# Patient Record
Sex: Female | Born: 1970
Health system: Southern US, Community
[De-identification: ages and names within clinical notes are randomized; demographics above are authoritative.]

## PROBLEM LIST (undated history)

## (undated) DIAGNOSIS — J45909 Unspecified asthma, uncomplicated: Secondary | ICD-10-CM

## (undated) DIAGNOSIS — R600 Localized edema: Secondary | ICD-10-CM

## (undated) DIAGNOSIS — F419 Anxiety disorder, unspecified: Secondary | ICD-10-CM

## (undated) DIAGNOSIS — K219 Gastro-esophageal reflux disease without esophagitis: Secondary | ICD-10-CM

## (undated) DIAGNOSIS — E119 Type 2 diabetes mellitus without complications: Secondary | ICD-10-CM

## (undated) DIAGNOSIS — F32A Depression, unspecified: Secondary | ICD-10-CM

## (undated) DIAGNOSIS — I10 Essential (primary) hypertension: Secondary | ICD-10-CM

## (undated) DIAGNOSIS — E7800 Pure hypercholesterolemia, unspecified: Secondary | ICD-10-CM

## (undated) DIAGNOSIS — R0602 Shortness of breath: Secondary | ICD-10-CM

## (undated) DIAGNOSIS — F329 Major depressive disorder, single episode, unspecified: Secondary | ICD-10-CM

## (undated) DIAGNOSIS — D649 Anemia, unspecified: Secondary | ICD-10-CM

## (undated) HISTORY — DX: Anxiety disorder, unspecified: F41.9

## (undated) HISTORY — PX: TUBAL LIGATION: SHX77

## (undated) HISTORY — DX: Shortness of breath: R06.02

## (undated) HISTORY — DX: Pure hypercholesterolemia, unspecified: E78.00

## (undated) HISTORY — DX: Localized edema: R60.0

## (undated) HISTORY — DX: Essential (primary) hypertension: I10

---

## 2003-08-06 ENCOUNTER — Emergency Department (HOSPITAL_COMMUNITY): Admission: EM | Admit: 2003-08-06 | Discharge: 2003-08-06 | Payer: Self-pay | Admitting: Emergency Medicine

## 2010-01-17 ENCOUNTER — Emergency Department (HOSPITAL_COMMUNITY)
Admission: EM | Admit: 2010-01-17 | Discharge: 2010-01-17 | Payer: Self-pay | Source: Home / Self Care | Admitting: Emergency Medicine

## 2010-09-21 ENCOUNTER — Emergency Department (HOSPITAL_COMMUNITY)
Admission: EM | Admit: 2010-09-21 | Discharge: 2010-09-21 | Disposition: A | Discharge status: Home / Self Care | Payer: PRIVATE HEALTH INSURANCE | Attending: Emergency Medicine | Admitting: Emergency Medicine

## 2010-09-21 DIAGNOSIS — R112 Nausea with vomiting, unspecified: Secondary | ICD-10-CM | POA: Insufficient documentation

## 2010-09-21 DIAGNOSIS — D649 Anemia, unspecified: Secondary | ICD-10-CM | POA: Insufficient documentation

## 2010-09-21 DIAGNOSIS — J029 Acute pharyngitis, unspecified: Secondary | ICD-10-CM | POA: Insufficient documentation

## 2010-09-21 DIAGNOSIS — R51 Headache: Secondary | ICD-10-CM

## 2010-09-21 HISTORY — DX: Anemia, unspecified: D64.9

## 2010-09-21 LAB — RAPID STREP SCREEN (MED CTR MEBANE ONLY): Streptococcus, Group A Screen (Direct): NEGATIVE

## 2010-09-21 MED ORDER — PROMETHAZINE HCL 25 MG/ML IJ SOLN
25.0000 mg | Freq: Once | INTRAMUSCULAR | Status: AC
  Administered 2010-09-21: 25 mg via INTRAMUSCULAR
  Filled 2010-09-21: qty 1

## 2010-09-21 MED ORDER — PROMETHAZINE HCL 25 MG PO TABS: 25.0000 mg | ORAL_TABLET | Freq: Four times a day (QID) | ORAL | Status: AC | PRN

## 2010-09-21 MED ORDER — KETOROLAC TROMETHAMINE 60 MG/2ML IM SOLN
60.0000 mg | Freq: Once | INTRAMUSCULAR | Status: AC
  Administered 2010-09-21: 60 mg via INTRAMUSCULAR
  Filled 2010-09-21: qty 2

## 2010-09-21 MED ORDER — ONDANSETRON HCL 4 MG PO TABS
8.0000 mg | ORAL_TABLET | Freq: Once | ORAL | Status: AC
  Administered 2010-09-21: 8 mg via ORAL
  Filled 2010-09-21: qty 1

## 2010-09-21 NOTE — ED Notes (Signed)
Complain of n/v/d and fever since last night

## 2010-09-21 NOTE — ED Provider Notes (Signed)
Scribed for Ward Givens, MD, the patient was seen in room APA08/APA08 . This chart was scribed by Ellie Lunch. This patient's care was started at 5:45 PM.   CSN: [id number] Arrival date & time: 09/21/2010  5:36 PM  Chief Complaint  Patient presents with  . Emesis   HPI Vanessa George is a healthy 40 y.o. female who presents to the Emergency Department complaining of sore throat and emesis. Pt c/o a sore throat starting yesterday with associated chills, lightheadedness, and nonproductive cough. Pt took dayquil today with enough relief to go to work. About an hour ago Pt began to feel nauseous, had one episode of emesis, and developed a headache. Pt denies abd pain, fever, chest pain or SOB. Denies sick contacts. States she feels lightheaded. Denies diarrhea.   Past Medical History  Diagnosis Date  . Anemia     History reviewed. No pertinent past surgical history.  MEDICATIONS:  Previous Medications   FERROUS SULFATE 325 (65 FE) MG TABLET    Take 325 mg by mouth daily with breakfast.       ALLERGIES:  Allergies as of 09/21/2010  . (No Known Allergies)     History reviewed. No pertinent family history.  History  Substance Use Topics  . Smoking status: Never Smoker   . Smokeless tobacco: Not on file  . Alcohol Use: No  Works at [employer]. No PCP. Married  Review of Systems  Constitutional: Positive for chills. Negative for fever.  HENT: Positive for sore throat.   Eyes: Positive for photophobia.  Respiratory: Positive for cough. Negative for shortness of breath.   Cardiovascular: Negative for chest pain.  Gastrointestinal: Positive for nausea and vomiting. Negative for abdominal pain and diarrhea.  Neurological: Positive for light-headedness and headaches.  All other systems reviewed and are negative.    Physical Exam  BP 150/81  Pulse 90  Temp(Src) 98.4 F (36.9 C) (Oral)  Resp 16  Ht 5\' 6"  (1.676 m)  Wt 180 lb (81.647 kg)  BMI 29.05 kg/m2  SpO2 100%   LMP 09/03/2010  Physical Exam  Constitutional: She is oriented to person, place, and time. She appears well-developed and well-nourished.  HENT:  Head: Normocephalic and atraumatic.  Mouth/Throat: Uvula is midline, oropharynx is clear and moist and mucous membranes are normal. No oropharyngeal exudate, posterior oropharyngeal edema, posterior oropharyngeal erythema or tonsillar abscesses.  Eyes: Conjunctivae and EOM are normal. Pupils are equal, round, and reactive to light.  Neck: Normal range of motion. Neck supple.  Cardiovascular: Normal rate and normal heart sounds.   Pulmonary/Chest: Effort normal and breath sounds normal.  Abdominal: Soft. Bowel sounds are normal.  Musculoskeletal: Normal range of motion.  Neurological: She is alert and oriented to person, place, and time.  Skin: Skin is warm and dry.  Psychiatric: She has a normal mood and affect. Her behavior is normal. Thought content normal.    Procedures  OTHER DATA REVIEWED: Nursing notes, vital signs, and past medical records reviewed.  DIAGNOSTIC STUDIES: Oxygen Saturation is 100% on room air, normal by my interpretation.    LABS: Results for orders placed during the hospital encounter of 09/21/10  RAPID STREP SCREEN      Component Value Range   Streptococcus, Group A Screen (Direct) NEGATIVE  NEGATIVE    ED COURSE / COORDINATION OF CARE:  MDM:  MEDICATIONS GIVEN IN THE E.D.  Medications  ondansetron (ZOFRAN) tablet 8 mg (8 mg Oral Given 09/21/10 1833)  ketorolac (TORADOL) injection 60 mg (  60 mg Intramuscular Given 09/21/10 1834)  promethazine (PHENERGAN) injection 25 mg (25 mg Intramuscular Given 09/21/10 1834)    Pt given above meds, states her nausea is gone, her headache is gone, still has a sore throat.   DISCHARGE MEDICATIONS: New Prescriptions   No medications on file    I personally performed the services described in this documentation, which was scribed in my presence. The recorded information  has been reviewed and considered. Devoria Albe, MD, Armando Gang       Ward Givens, MD 09/21/10 [phone]

## 2010-09-21 NOTE — ED Notes (Signed)
Pt states had sore throat yesterday, however has progressed today into headache, N/V, fever and chills. 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

## 2010-09-27 ENCOUNTER — Encounter (HOSPITAL_COMMUNITY): Payer: Self-pay

## 2010-09-27 ENCOUNTER — Emergency Department (HOSPITAL_COMMUNITY): Payer: PRIVATE HEALTH INSURANCE

## 2010-09-27 ENCOUNTER — Emergency Department (HOSPITAL_COMMUNITY)
Admission: EM | Admit: 2010-09-27 | Discharge: 2010-09-27 | Disposition: A | Discharge status: Home / Self Care | Payer: PRIVATE HEALTH INSURANCE | Attending: Emergency Medicine | Admitting: Emergency Medicine

## 2010-09-27 DIAGNOSIS — Z862 Personal history of diseases of the blood and blood-forming organs and certain disorders involving the immune mechanism: Secondary | ICD-10-CM | POA: Insufficient documentation

## 2010-09-27 DIAGNOSIS — J40 Bronchitis, not specified as acute or chronic: Secondary | ICD-10-CM | POA: Insufficient documentation

## 2010-09-27 DIAGNOSIS — H6690 Otitis media, unspecified, unspecified ear: Secondary | ICD-10-CM | POA: Insufficient documentation

## 2010-09-27 LAB — POCT PREGNANCY, URINE: Preg Test, Ur: NEGATIVE

## 2010-09-27 MED ORDER — ALBUTEROL SULFATE HFA 108 (90 BASE) MCG/ACT IN AERS
2.0000 | INHALATION_SPRAY | Freq: Once | RESPIRATORY_TRACT | Status: AC
  Administered 2010-09-27: 2 via RESPIRATORY_TRACT
  Filled 2010-09-27 (×2): qty 6.7

## 2010-09-27 MED ORDER — AZITHROMYCIN 250 MG PO TABS
500.0000 mg | ORAL_TABLET | Freq: Once | ORAL | Status: AC
  Administered 2010-09-27: 500 mg via ORAL
  Filled 2010-09-27: qty 2

## 2010-09-27 MED ORDER — AZITHROMYCIN 250 MG PO TABS: ORAL_TABLET | ORAL | Status: AC

## 2010-09-27 NOTE — ED Provider Notes (Signed)
History     CSN: [id number] Arrival date & time: 09/27/2010  2:51 PM  Chief Complaint  Patient presents with  . Cough  . Nasal Congestion  . Shortness of Breath  . Ear Fullness   Patient is a 40 y.o. female presenting with cough, shortness of breath, and plugged ear sensation. The history is provided by the patient.  Cough This is a new problem. The current episode started more than 2 days ago. The cough is productive of sputum. Maximum temperature: Subjective fevers. Associated symptoms include ear congestion, ear pain, rhinorrhea, sore throat and shortness of breath. Pertinent negatives include no chest pain and no headaches. Associated symptoms comments: Nasal congestion with yellow drainage.  Also with complaint of coughing yellow sputum.  Bilateral ears feel plugged with the left also causing pain.. She is a smoker. Her past medical history does not include pneumonia or asthma.  Shortness of Breath  Associated symptoms include a fever, rhinorrhea, sore throat, cough and shortness of breath. Pertinent negatives include no chest pain. Her past medical history does not include asthma.  Ear Fullness This is a new problem. The current episode started yesterday. The problem occurs constantly. The problem has been unchanged. Associated symptoms include congestion, coughing, a fever and a sore throat. Pertinent negatives include no abdominal pain, arthralgias, chest pain, headaches, joint swelling, nausea, neck pain, numbness, rash, swollen glands, vertigo or weakness. Treatments tried: otc cough and cold remedies, sudafed. The treatment provided no relief.    Past Medical History  Diagnosis Date  . Anemia   . Anemia     History reviewed. No pertinent past surgical history.  No family history on file.  History  Substance Use Topics  . Smoking status: Never Smoker   . Smokeless tobacco: Not on file  . Alcohol Use: No    OB History    Grav Para Term Preterm Abortions TAB SAB Ect Mult  Living                  Review of Systems  Constitutional: Positive for fever.  HENT: Positive for hearing loss, ear pain, congestion, sore throat, rhinorrhea, postnasal drip and sinus pressure. Negative for nosebleeds, facial swelling, neck pain, tinnitus and ear discharge.   Eyes: Negative.   Respiratory: Positive for cough and shortness of breath. Negative for chest tightness.   Cardiovascular: Negative for chest pain.  Gastrointestinal: Negative for nausea and abdominal pain.  Genitourinary: Negative.   Musculoskeletal: Negative for joint swelling and arthralgias.  Skin: Negative.  Negative for rash and wound.  Neurological: Negative for dizziness, vertigo, weakness, light-headedness, numbness and headaches.  Hematological: Negative.   Psychiatric/Behavioral: Negative.     Physical Exam  BP 146/75  Pulse 88  Temp(Src) 98.6 F (37 C) (Oral)  Resp 18  Ht 5\' 6"  (1.676 m)  Wt 180 lb (81.647 kg)  BMI 29.05 kg/m2  SpO2 100%  LMP 09/03/2010  Physical Exam  Nursing note and vitals reviewed. Constitutional: She is oriented to person, place, and time. She appears well-developed and well-nourished.  HENT:  Head: Normocephalic and atraumatic.  Left Ear: There is swelling. Tympanic membrane is injected and bulging.  Eyes: Conjunctivae are normal.  Neck: Normal range of motion.  Cardiovascular: Normal rate, regular rhythm, normal heart sounds and intact distal pulses.   Pulmonary/Chest: Effort normal. No respiratory distress. She has wheezes in the right lower field. She has rhonchi in the left upper field. She has no rales. She exhibits no tenderness.  Abdominal: Soft.  Bowel sounds are normal. There is no tenderness.  Musculoskeletal: Normal range of motion.  Neurological: She is alert and oriented to person, place, and time.  Skin: Skin is warm and dry.  Psychiatric: She has a normal mood and affect.    ED Course  Procedures  MDM cxr with no evidence of active infection.   Otitis media - left per exam.        Candis Musa, PA 09/27/10 1658  Correction - patient does not smoke tobacco products.  Candis Musa, PA 09/27/10 1704  Candis Musa, PA 09/27/10 1709  Candis Musa, PA 09/27/10 1711

## 2010-09-27 NOTE — ED Notes (Signed)
Pt reports coming here on last Tues for sore thoat and cough.  Pt has since worsened and now has congestion, and some left ear fullness, and some sob.

## 2010-09-28 NOTE — ED Provider Notes (Signed)
Medical screening examination/treatment/procedure(s) were performed by non-physician practitioner and as supervising physician I was immediately available for consultation/collaboration. Devoria Albe, MD, [facility] Gang  Ward Givens, MD 09/28/10 [phone]

## 2012-08-02 ENCOUNTER — Other Ambulatory Visit (HOSPITAL_COMMUNITY): Payer: Self-pay | Admitting: Physician Assistant

## 2012-08-02 DIAGNOSIS — Z139 Encounter for screening, unspecified: Secondary | ICD-10-CM

## 2012-08-07 ENCOUNTER — Ambulatory Visit (HOSPITAL_COMMUNITY)
Admission: RE | Admit: 2012-08-07 | Discharge: 2012-08-07 | Disposition: A | Discharge status: Home / Self Care | Payer: BC Managed Care – PPO | Source: Ambulatory Visit | Attending: Physician Assistant | Admitting: Physician Assistant

## 2012-08-07 DIAGNOSIS — Z139 Encounter for screening, unspecified: Secondary | ICD-10-CM

## 2012-08-07 DIAGNOSIS — Z1231 Encounter for screening mammogram for malignant neoplasm of breast: Secondary | ICD-10-CM | POA: Insufficient documentation

## 2013-03-24 ENCOUNTER — Emergency Department (HOSPITAL_COMMUNITY): Payer: BC Managed Care – PPO

## 2013-03-24 ENCOUNTER — Emergency Department (HOSPITAL_COMMUNITY)
Admission: EM | Admit: 2013-03-24 | Discharge: 2013-03-24 | Disposition: A | Discharge status: Home / Self Care | Payer: BC Managed Care – PPO | Attending: Emergency Medicine | Admitting: Emergency Medicine

## 2013-03-24 ENCOUNTER — Encounter (HOSPITAL_COMMUNITY): Payer: Self-pay | Admitting: Emergency Medicine

## 2013-03-24 DIAGNOSIS — Y929 Unspecified place or not applicable: Secondary | ICD-10-CM | POA: Insufficient documentation

## 2013-03-24 DIAGNOSIS — X500XXA Overexertion from strenuous movement or load, initial encounter: Secondary | ICD-10-CM | POA: Insufficient documentation

## 2013-03-24 DIAGNOSIS — M51379 Other intervertebral disc degeneration, lumbosacral region without mention of lumbar back pain or lower extremity pain: Secondary | ICD-10-CM | POA: Insufficient documentation

## 2013-03-24 DIAGNOSIS — S39012A Strain of muscle, fascia and tendon of lower back, initial encounter: Secondary | ICD-10-CM

## 2013-03-24 DIAGNOSIS — R209 Unspecified disturbances of skin sensation: Secondary | ICD-10-CM | POA: Insufficient documentation

## 2013-03-24 DIAGNOSIS — M5137 Other intervertebral disc degeneration, lumbosacral region: Secondary | ICD-10-CM | POA: Insufficient documentation

## 2013-03-24 DIAGNOSIS — Y9389 Activity, other specified: Secondary | ICD-10-CM | POA: Insufficient documentation

## 2013-03-24 DIAGNOSIS — Z79899 Other long term (current) drug therapy: Secondary | ICD-10-CM | POA: Insufficient documentation

## 2013-03-24 DIAGNOSIS — S335XXA Sprain of ligaments of lumbar spine, initial encounter: Secondary | ICD-10-CM | POA: Insufficient documentation

## 2013-03-24 DIAGNOSIS — Z3202 Encounter for pregnancy test, result negative: Secondary | ICD-10-CM | POA: Insufficient documentation

## 2013-03-24 DIAGNOSIS — Y990 Civilian activity done for income or pay: Secondary | ICD-10-CM | POA: Insufficient documentation

## 2013-03-24 DIAGNOSIS — D649 Anemia, unspecified: Secondary | ICD-10-CM | POA: Insufficient documentation

## 2013-03-24 DIAGNOSIS — IMO0002 Reserved for concepts with insufficient information to code with codable children: Secondary | ICD-10-CM | POA: Insufficient documentation

## 2013-03-24 LAB — POC URINE PREG, ED: PREG TEST UR: NEGATIVE

## 2013-03-24 MED ORDER — OXYCODONE-ACETAMINOPHEN 5-325 MG PO TABS: 0.5000 | ORAL_TABLET | ORAL | Status: DC | PRN

## 2013-03-24 MED ORDER — OXYCODONE-ACETAMINOPHEN 5-325 MG PO TABS
1.0000 | ORAL_TABLET | Freq: Once | ORAL | Status: AC
  Administered 2013-03-24: 1 via ORAL
  Filled 2013-03-24: qty 1

## 2013-03-24 MED ORDER — IBUPROFEN 600 MG PO TABS: 600.0000 mg | ORAL_TABLET | Freq: Four times a day (QID) | ORAL | Status: DC | PRN

## 2013-03-24 NOTE — ED Notes (Signed)
Pt helped a patient getting off the floor about a week ago, pt now has lower back now and has increased, today pain radiating down right and left legs, states toes numb feeling

## 2013-03-24 NOTE — ED Provider Notes (Signed)
CSN: [id number]     Arrival date & time 03/24/13  1333 History   First MD Initiated Contact with Patient 03/24/13 1355     Chief Complaint  Patient presents with  . Back Pain     (Consider location/radiation/quality/duration/timing/severity/associated sxs/prior Treatment) HPI Comments: Tabria Steines is a 43 y.o. 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  She has a history of occasional low back pain, but has resolved with a day or 2 of rest.  She described pain which radiates down her medial and posterior legs to her mid thighs.  She also has numbness in her bilateral great toes only.  She denies weakness in her legs, and has had no saddle anesthesia, no urinary or bowel retention or incontinence.  She has taken ibuprofen without pain relief.       The history is provided by the patient.    Past Medical History  Diagnosis Date  . Anemia   . Anemia    Past Surgical History  Procedure Laterality Date  . Ectopic pregnancy surgery     History reviewed. No pertinent family history. History  Substance Use Topics  . Smoking status: Never Smoker   . Smokeless tobacco: Not on file  . Alcohol Use: No   OB History   Grav Para Term Preterm Abortions TAB SAB Ect Mult Living                 Review of Systems  Constitutional: Negative for fever.  Respiratory: Negative for shortness of breath.   Cardiovascular: Negative for chest pain and leg swelling.  Gastrointestinal: Negative for abdominal pain, constipation and abdominal distention.  Genitourinary: Negative for dysuria, urgency, frequency, flank pain and difficulty urinating.  Musculoskeletal: Positive for back pain. Negative for gait problem and joint swelling.  Skin: Negative for rash.  Neurological: Positive for numbness. Negative for weakness.      Allergies  Vicodin  Home Medications   Current Outpatient Rx   Name  Route  Sig  Dispense  Refill  . ferrous sulfate 325 (65 FE) MG tablet   Oral   Take 325 mg by mouth every other day.          . Multiple Vitamin (MULTIVITAMIN WITH MINERALS) TABS tablet   Oral   Take 1 tablet by mouth daily.         . VENTOLIN HFA 108 (90 BASE) MCG/ACT inhaler   Inhalation   Inhale 2 puffs into the lungs every 4 (four) hours as needed for wheezing or shortness of breath.          Marland Kitchen ibuprofen (ADVIL,MOTRIN) 600 MG tablet   Oral   Take 1 tablet (600 mg total) by mouth every 6 (six) hours as needed.   30 tablet   0   . nitrofurantoin, macrocrystal-monohydrate, (MACROBID) 100 MG capsule   Oral   Take 100 mg by mouth 2 (two) times daily.         Marland Kitchen oxyCODONE-acetaminophen (PERCOCET/ROXICET) 5-325 MG per tablet   Oral   Take 0.5-1 tablets by mouth every 4 (four) hours as needed for severe pain.   15 tablet   0    BP 155/80  Pulse 78  Temp(Src) 97.4 F (36.3 C) (Oral)  Resp 20  Ht 5\' 5"  (1.651 m)  Wt 230 lb (104.327 kg)  BMI 38.27 kg/m2  SpO2 100%  LMP 03/05/2013 Physical Exam  Nursing note and vitals reviewed. Constitutional: She appears well-developed and well-nourished.  HENT:  Head: Normocephalic.  Eyes: Conjunctivae are normal.  Neck: Normal range of motion. Neck supple.  Cardiovascular: Normal rate and intact distal pulses.   Pulses:      Dorsalis pedis pulses are 2+ on the right side, and 2+ on the left side.  Pedal pulses normal.  Pulmonary/Chest: Effort normal.  Abdominal: Soft. Bowel sounds are normal. She exhibits no distension and no mass.  Musculoskeletal: Normal range of motion. She exhibits no edema.       Lumbar back: She exhibits tenderness. She exhibits no swelling, no edema and no spasm.  Neurological: She is alert. She has normal strength. She displays no atrophy and no tremor. No sensory deficit. Gait normal.  Reflex Scores:      Patellar reflexes are 2+ on the right side and 2+ on the left side.      Achilles  reflexes are 2+ on the right side and 2+ on the left side. No strength deficit noted in hip and knee flexor and extensor muscle groups.  Ankle flexion and extension intact. Negative babinski.    Skin: Skin is warm and dry.  Psychiatric: She has a normal mood and affect.    ED Course  Procedures (including critical care time) Labs Review Labs Reviewed  POC URINE PREG, ED   Imaging Review Dg Lumbar Spine Complete  03/24/2013   CLINICAL DATA:  Low back pain after lifting  EXAM: LUMBAR SPINE - COMPLETE 4+ VIEW  COMPARISON:  09/03/2010  FINDINGS: There is no evidence of lumbar spine fracture. Alignment is normal. Early narrowing of the L4-5 interspace as before. Facet degenerative hypertrophy and spurring bilaterally L4-5 and L5-S1.  IMPRESSION: 1. Negative for fracture or other acute bone abnormality. 2. Stable degenerative changes L4-S1   Electronically Signed   By: Arne Cleveland M.D.   On: 03/24/2013 15:51     EKG Interpretation None      MDM   Final diagnoses:  Lumbar strain    Lumbar paralumbar strain with radiculopathy.  No obvious deformity on plain xrays,  Chronic mild djd at L5-S1.  Pt was prescribed ibuprofen, oxycodone - 1/2 to 1 tab q 4 hours.  Activity as tolerated without bending, or lifting.  She was given a work note with these restrictions.  Encouraged heat therapy to lower back.  Plan followup by PCP for recheck in one week if symptoms are not improving.  No neuro deficit on exam or by history to suggest emergent or surgical presentation.  Also discussed worsened sx that should prompt immediate re-evaluation including distal weakness, bowel/bladder retention/incontinence.          Evalee Jefferson, PA-C 03/24/13 [phone]

## 2013-03-24 NOTE — ED Provider Notes (Signed)
Medical screening examination/treatment/procedure(s) were performed by non-physician practitioner and as supervising physician I was immediately available for consultation/collaboration.   EKG Interpretation None       Nat Christen, MD 03/24/13 2239

## 2013-03-24 NOTE — Discharge Instructions (Signed)
Back Pain, Adult Low back pain is very common. About 1 in 5 people have back pain.The cause of low back pain is rarely dangerous. The pain often gets better over time.About half of people with a sudden onset of back pain feel better in just 2 weeks. About 8 in 10 people feel better by 6 weeks.  CAUSES Some common causes of back pain include:  Strain of the muscles or ligaments supporting the spine.  Wear and tear (degeneration) of the spinal discs.  Arthritis.  Direct injury to the back. DIAGNOSIS Most of the time, the direct cause of low back pain is not known.However, back pain can be treated effectively even when the exact cause of the pain is unknown.Answering your caregiver's questions about your overall health and symptoms is one of the most accurate ways to make sure the cause of your pain is not dangerous. If your caregiver needs more information, he or she may order lab work or imaging tests (X-rays or MRIs).However, even if imaging tests show changes in your back, this usually does not require surgery. HOME CARE INSTRUCTIONS For many people, back pain returns.Since low back pain is rarely dangerous, it is often a condition that people can learn to [facility] their own.   Remain active. It is stressful on the back to sit or stand in one place. Do not sit, drive, or stand in one place for more than 30 minutes at a time. Take short walks on level surfaces as soon as pain allows.Try to increase the length of time you walk each day.  Do not stay in bed.Resting more than 1 or 2 days can delay your recovery.  Do not avoid exercise or work.Your body is made to move.It is not dangerous to be active, even though your back may hurt.Your back will likely heal faster if you return to being active before your pain is gone.  Pay attention to your body when you bend and lift. Many people have less discomfortwhen lifting if they bend their knees, keep the load close to their bodies,and  avoid twisting. Often, the most comfortable positions are those that put less stress on your recovering back.  Find a comfortable position to sleep. Use a firm mattress and lie on your side with your knees slightly bent. If you lie on your back, put a pillow under your knees.  Only take over-the-counter or prescription medicines as directed by your caregiver. Over-the-counter medicines to reduce pain and inflammation are often the most helpful.Your caregiver may prescribe muscle relaxant drugs.These medicines help dull your pain so you can more quickly return to your normal activities and healthy exercise.  Put ice on the injured area.  Put ice in a plastic bag.  Place a towel between your skin and the bag.  Leave the ice on for 15-20 minutes, 03-04 times a day for the first 2 to 3 days. After that, ice and heat may be alternated to reduce pain and spasms.  Ask your caregiver about trying back exercises and gentle massage. This may be of some benefit.  Avoid feeling anxious or stressed.Stress increases muscle tension and can worsen back pain.It is important to recognize when you are anxious or stressed and learn ways to manage it.Exercise is a great option. SEEK MEDICAL CARE IF:  You have pain that is not relieved with rest or medicine.  You have pain that does not improve in 1 week.  You have new symptoms.  You are generally not feeling well. SEEK  IMMEDIATE MEDICAL CARE IF:   You have pain that radiates from your back into your legs.  You develop new bowel or bladder control problems.  You have unusual weakness or numbness in your arms or legs.  You develop nausea or vomiting.  You develop abdominal pain.  You feel faint. Document Released: 01/10/2005 Document Revised: 07/12/2011 Document Reviewed: 05/31/2010 [facility] Patient Information 2014 [place], [place].  Use the medications as directed.  Do not drive within 4 hours of taking oxycodone as this will make you  drowsy.  Avoid lifting,  Bending,  Twisting or any other activity that worsens your pain over the next week.  Apply an  icepack  to your lower back for 10-15 minutes every 2 hours for the next 2 days.  You should get rechecked if your symptoms are not better over the next 5 days,  Or you develop increased pain,  Weakness in your leg(s) or loss of bladder or bowel function - these are symptoms of a worse injury.

## 2014-04-28 ENCOUNTER — Encounter (HOSPITAL_COMMUNITY): Payer: Self-pay | Admitting: Emergency Medicine

## 2014-04-28 ENCOUNTER — Emergency Department (HOSPITAL_COMMUNITY)
Admission: EM | Admit: 2014-04-28 | Discharge: 2014-04-28 | Disposition: A | Discharge status: Home / Self Care | Payer: Self-pay | Attending: Emergency Medicine | Admitting: Emergency Medicine

## 2014-04-28 ENCOUNTER — Emergency Department (HOSPITAL_COMMUNITY): Payer: Self-pay

## 2014-04-28 DIAGNOSIS — Y9389 Activity, other specified: Secondary | ICD-10-CM | POA: Insufficient documentation

## 2014-04-28 DIAGNOSIS — Z79899 Other long term (current) drug therapy: Secondary | ICD-10-CM | POA: Insufficient documentation

## 2014-04-28 DIAGNOSIS — Y998 Other external cause status: Secondary | ICD-10-CM | POA: Insufficient documentation

## 2014-04-28 DIAGNOSIS — D649 Anemia, unspecified: Secondary | ICD-10-CM | POA: Insufficient documentation

## 2014-04-28 DIAGNOSIS — S39012A Strain of muscle, fascia and tendon of lower back, initial encounter: Secondary | ICD-10-CM | POA: Insufficient documentation

## 2014-04-28 DIAGNOSIS — Y92410 Unspecified street and highway as the place of occurrence of the external cause: Secondary | ICD-10-CM | POA: Insufficient documentation

## 2014-04-28 MED ORDER — IBUPROFEN 800 MG PO TABS
800.0000 mg | ORAL_TABLET | Freq: Once | ORAL | Status: AC
  Administered 2014-04-28: 800 mg via ORAL
  Filled 2014-04-28: qty 1

## 2014-04-28 MED ORDER — NAPROXEN 500 MG PO TABS: 500.0000 mg | ORAL_TABLET | Freq: Two times a day (BID) | ORAL | Status: DC
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
## 2014-07-17 ENCOUNTER — Other Ambulatory Visit (HOSPITAL_COMMUNITY): Payer: Self-pay | Admitting: Family Medicine

## 2014-07-17 DIAGNOSIS — Z1231 Encounter for screening mammogram for malignant neoplasm of breast: Secondary | ICD-10-CM

## 2014-08-04 ENCOUNTER — Ambulatory Visit (HOSPITAL_COMMUNITY): Payer: Self-pay

## 2014-08-06 ENCOUNTER — Ambulatory Visit (HOSPITAL_COMMUNITY)
Admission: RE | Admit: 2014-08-06 | Discharge: 2014-08-06 | Disposition: A | Discharge status: Home / Self Care | Payer: BLUE CROSS/BLUE SHIELD | Source: Ambulatory Visit | Attending: Family Medicine | Admitting: Family Medicine

## 2014-08-06 DIAGNOSIS — Z1231 Encounter for screening mammogram for malignant neoplasm of breast: Secondary | ICD-10-CM | POA: Insufficient documentation

## 2015-08-07 DIAGNOSIS — Z1389 Encounter for screening for other disorder: Secondary | ICD-10-CM | POA: Diagnosis not present

## 2015-08-07 DIAGNOSIS — K219 Gastro-esophageal reflux disease without esophagitis: Secondary | ICD-10-CM | POA: Diagnosis not present

## 2015-08-07 DIAGNOSIS — Z6841 Body Mass Index (BMI) 40.0 and over, adult: Secondary | ICD-10-CM | POA: Diagnosis not present

## 2015-08-07 DIAGNOSIS — R12 Heartburn: Secondary | ICD-10-CM | POA: Diagnosis not present

## 2015-08-17 ENCOUNTER — Other Ambulatory Visit (HOSPITAL_COMMUNITY): Payer: Self-pay | Admitting: Family Medicine

## 2015-08-17 DIAGNOSIS — Z1231 Encounter for screening mammogram for malignant neoplasm of breast: Secondary | ICD-10-CM

## 2015-08-21 ENCOUNTER — Ambulatory Visit (HOSPITAL_COMMUNITY): Payer: BLUE CROSS/BLUE SHIELD

## 2015-08-28 ENCOUNTER — Encounter (HOSPITAL_COMMUNITY): Payer: Self-pay | Admitting: Radiology

## 2015-08-28 ENCOUNTER — Ambulatory Visit (HOSPITAL_COMMUNITY)
Admission: RE | Admit: 2015-08-28 | Discharge: 2015-08-28 | Disposition: A | Discharge status: Home / Self Care | Payer: BLUE CROSS/BLUE SHIELD | Source: Ambulatory Visit | Attending: Family Medicine | Admitting: Family Medicine

## 2015-08-28 DIAGNOSIS — Z1231 Encounter for screening mammogram for malignant neoplasm of breast: Secondary | ICD-10-CM | POA: Diagnosis not present

## 2015-10-19 DIAGNOSIS — R3129 Other microscopic hematuria: Secondary | ICD-10-CM | POA: Diagnosis not present

## 2015-10-22 DIAGNOSIS — R3129 Other microscopic hematuria: Secondary | ICD-10-CM | POA: Diagnosis not present

## 2015-10-22 DIAGNOSIS — K760 Fatty (change of) liver, not elsewhere classified: Secondary | ICD-10-CM | POA: Diagnosis not present

## 2015-11-03 DIAGNOSIS — R3129 Other microscopic hematuria: Secondary | ICD-10-CM | POA: Diagnosis not present

## 2015-12-24 ENCOUNTER — Emergency Department (HOSPITAL_COMMUNITY)
Admission: EM | Admit: 2015-12-24 | Discharge: 2015-12-24 | Disposition: A | Discharge status: Home / Self Care | Payer: BLUE CROSS/BLUE SHIELD | Attending: Emergency Medicine | Admitting: Emergency Medicine

## 2015-12-24 ENCOUNTER — Encounter (HOSPITAL_COMMUNITY): Payer: Self-pay | Admitting: Emergency Medicine

## 2015-12-24 ENCOUNTER — Emergency Department (HOSPITAL_COMMUNITY): Payer: BLUE CROSS/BLUE SHIELD

## 2015-12-24 DIAGNOSIS — M79602 Pain in left arm: Secondary | ICD-10-CM | POA: Diagnosis not present

## 2015-12-24 DIAGNOSIS — Z79899 Other long term (current) drug therapy: Secondary | ICD-10-CM | POA: Diagnosis not present

## 2015-12-24 DIAGNOSIS — R079 Chest pain, unspecified: Secondary | ICD-10-CM

## 2015-12-24 DIAGNOSIS — J45909 Unspecified asthma, uncomplicated: Secondary | ICD-10-CM | POA: Insufficient documentation

## 2015-12-24 DIAGNOSIS — Z7982 Long term (current) use of aspirin: Secondary | ICD-10-CM | POA: Insufficient documentation

## 2015-12-24 DIAGNOSIS — I10 Essential (primary) hypertension: Secondary | ICD-10-CM | POA: Diagnosis not present

## 2015-12-24 HISTORY — DX: Unspecified asthma, uncomplicated: J45.909

## 2015-12-24 HISTORY — DX: Gastro-esophageal reflux disease without esophagitis: K21.9

## 2015-12-24 LAB — CBC
HCT: 47.5 % — ABNORMAL HIGH (ref 36.0–46.0)
Hemoglobin: 15.7 g/dL — ABNORMAL HIGH (ref 12.0–15.0)
MCH: 27.2 pg (ref 26.0–34.0)
MCHC: 33.1 g/dL (ref 30.0–36.0)
MCV: 82.3 fL (ref 78.0–100.0)
Platelets: 232 10*3/uL (ref 150–400)
RBC: 5.77 MIL/uL — ABNORMAL HIGH (ref 3.87–5.11)
RDW: 15.9 % — ABNORMAL HIGH (ref 11.5–15.5)
WBC: 13.2 10*3/uL — ABNORMAL HIGH (ref 4.0–10.5)

## 2015-12-24 LAB — BASIC METABOLIC PANEL
Anion gap: 9 (ref 5–15)
BUN: 10 mg/dL (ref 6–20)
CO2: 29 mmol/L (ref 22–32)
Calcium: 9.4 mg/dL (ref 8.9–10.3)
Chloride: 97 mmol/L — ABNORMAL LOW (ref 101–111)
Creatinine, Ser: 1.08 mg/dL — ABNORMAL HIGH (ref 0.44–1.00)
GFR calc Af Amer: >60 mL/min (ref >60)
GFR calc non Af Amer: >60 mL/min (ref >60)
Glucose, Bld: 97 mg/dL (ref 65–99)
Potassium: 3.2 mmol/L — ABNORMAL LOW (ref 3.5–5.1)
Sodium: 135 mmol/L (ref 135–145)

## 2015-12-24 LAB — TROPONIN I
Troponin I: <0.03 ng/mL (ref <0.03)
Troponin I: <0.03 ng/mL (ref <0.03)

## 2015-12-24 NOTE — ED Triage Notes (Signed)
Pt reports intermittent cp in left chest starting 2 days ago radiating to left arm and to back. Pt has relief with aspirin or sprite. Denies injury. Pt alert and oriented at this time.

## 2015-12-24 NOTE — ED Provider Notes (Signed)
[facility] DEPT Provider Note   CSN: [id number] Arrival date & time: 12/24/15  1745  By signing my name below, I, Higinio Plan, attest that this documentation has been prepared under the direction and in the presence of Virgel Manifold, MD . Electronically Signed: Higinio Plan, Scribe. 12/24/2015. 9:10 PM.  History   Chief Complaint Chief Complaint  Patient presents with  . Chest Pain   The history is provided by the patient. No language interpreter was used.   HPI Comments: Vanessa George is a 45 y.o. female with PMHx of Acid Reflux and HTN, who presents to the Emergency Department complaining of gradually improving, intermittent, central chest pain that began yesterday morning. Pt reports her pain radiates into her left arm and "between her shoulder blades." She notes her episodes of chest pain last for "5-10 minutes" before it spontaneously improves. She states she is not currently experiencing any chest pain in the ED; she notes her last episode occurred at ~3:00 PM this afternoon. She reports her chest pain is not triggered by anything and is not exacerbated with certain movements or positions. Pt states she initially believed her symptoms were due to acid reflux as she has "had [employer] fried chicken two nights in a row." She states her symptoms of chest pain feel "somewhat similar" to her symptoms of acid reflux; she notes she was only placed on her medication for acid reflux 3 months ago. She reports she has tried taking aspirin and drinking sprite with mild relief. Pt denies shortness of breath, abdominal pain, diaphoresis, leg pain or swelling, recent long travel and hx of PE or stress tests.  Pt reports she recently received a CT scan ~1 month ago due to sudden onset, hematuria and received tests for her kidney function and cancer which were "negative."   PCP: Dr. Hilma Favors   Past Medical History:  Diagnosis Date  . Acid reflux   . Anemia   . Anemia   . Asthma   . Hypertension     There are no active problems to display for this patient.  Past Surgical History:  Procedure Laterality Date  . ECTOPIC PREGNANCY SURGERY      OB History    Gravida Para Term Preterm AB Living             0   SAB TAB Ectopic Multiple Live Births                 Home Medications    Prior to Admission medications   Medication Sig Start Date End Date Taking? Authorizing Provider  ferrous sulfate 325 (65 FE) MG tablet Take 325 mg by mouth every other day.     Historical Provider, MD  ibuprofen (ADVIL,MOTRIN) 600 MG tablet Take 1 tablet (600 mg total) by mouth every 6 (six) hours as needed. Patient not taking: Reported on 04/28/2014 03/24/13   Evalee Jefferson, PA-C  Multiple Vitamin (MULTIVITAMIN WITH MINERALS) TABS tablet Take 1 tablet by mouth daily.    Historical Provider, MD  naproxen (NAPROSYN) 500 MG tablet Take 1 tablet (500 mg total) by mouth 2 (two) times daily. 04/28/14   [place], NP  oxyCODONE-acetaminophen (PERCOCET/ROXICET) 5-325 MG per tablet Take 0.5-1 tablets by mouth every 4 (four) hours as needed for severe pain. Patient not taking: Reported on 04/28/2014 03/24/13   Evalee Jefferson, PA-C  VENTOLIN HFA 108 (90 BASE) MCG/ACT inhaler Inhale 2 puffs into the lungs every 4 (four) hours as needed for wheezing or shortness of breath.  02/27/13   Historical Provider, MD    Family History History reviewed. No pertinent family history.  Social History Social History  Substance Use Topics  . Smoking status: Never Smoker  . Smokeless tobacco: Not on file  . Alcohol use No   Allergies   Vicodin [hydrocodone-acetaminophen]  Review of Systems Review of Systems  Constitutional: Negative for diaphoresis.  Respiratory: Negative for shortness of breath.   Cardiovascular: Positive for chest pain. Negative for leg swelling.  Gastrointestinal: Negative for abdominal pain.  Musculoskeletal: Positive for arthralgias (left arm).  All other systems reviewed and are negative.  Physical  Exam Updated Vital Signs BP 189/94 (BP Location: Left Arm)   Pulse 101   Temp 98.3 F (36.8 C) (Oral)   Resp 18   Ht 5\' 5"  (1.651 m)   Wt 252 lb (114.3 kg)   LMP 12/24/2015   SpO2 100%   BMI 41.93 kg/m   Physical Exam  Constitutional: She is oriented to person, place, and time. She appears well-developed and well-nourished. No distress.  HENT:  Head: Normocephalic and atraumatic.  Eyes: EOM are normal.  Neck: Normal range of motion.  Cardiovascular: Normal rate, regular rhythm and normal heart sounds.   Pulmonary/Chest: Effort normal and breath sounds normal.  Abdominal: Soft. She exhibits no distension. There is no tenderness.  Musculoskeletal: Normal range of motion. She exhibits no edema.  No lower extremity edema   Neurological: She is alert and oriented to person, place, and time.  Skin: Skin is warm and dry.  Psychiatric: She has a normal mood and affect. Judgment normal.  Nursing note and vitals reviewed.  ED Treatments / Results  Labs (all labs ordered are listed, but only abnormal results are displayed) Labs Reviewed  BASIC METABOLIC PANEL - Abnormal; Notable for the following:       Result Value   Potassium 3.2 (*)    Chloride 97 (*)    Creatinine, Ser 1.08 (*)    All other components within normal limits  CBC - Abnormal; Notable for the following:    WBC 13.2 (*)    RBC 5.77 (*)    Hemoglobin 15.7 (*)    HCT 47.5 (*)    RDW 15.9 (*)    All other components within normal limits  TROPONIN I  TROPONIN I    EKG  EKG Interpretation  Date/Time:  Thursday December 24 2015 17:55:26 EST Ventricular Rate:  98 PR Interval:  168 QRS Duration: 84 QT Interval:  346 QTC Calculation: 441 R Axis:   74 Text Interpretation:  Normal sinus rhythm Cannot rule out Anterior infarct , age undetermined Abnormal ECG Non-specific ST-t changes No old tracing to compare Confirmed by Wilson Singer  MD, Kemp [phone]) on 12/24/2015 8:17:23 PM       Radiology Dg Chest 2  View  Result Date: 12/24/2015 CLINICAL DATA:  Central to left-sided chest pain with radiation to the scapula EXAM: CHEST  2 VIEW COMPARISON:  None. FINDINGS: The heart size and mediastinal contours are within normal limits. Both lungs are clear. The visualized skeletal structures are unremarkable. IMPRESSION: No active cardiopulmonary disease. Electronically Signed   By: Donavan Foil M.D.   On: 12/24/2015 18:21    Procedures Procedures (including critical care time)  Medications Ordered in ED Medications - No data to display  DIAGNOSTIC STUDIES:  Oxygen Saturation is 100% on RA, normal by my interpretation.    COORDINATION OF CARE:  9:05 PM Discussed treatment plan with pt at bedside and pt  agreed to plan.  Initial Impression / Assessment and Plan / ED Course  I have reviewed the triage vital signs and the nursing notes.  Pertinent labs & imaging results that were available during my care of the patient were reviewed by me and considered in my medical decision making (see chart for details).  Clinical Course     I personally preformed the services scribed in my presence. The recorded information has been reviewed is accurate. Virgel Manifold, MD.   Final Clinical Impressions(s) / ED Diagnoses   Final diagnoses:  Nonspecific chest pain    New Prescriptions New Prescriptions   No medications on file     Virgel Manifold, MD 01/01/16 [phone]

## 2016-01-04 DIAGNOSIS — E786 Lipoprotein deficiency: Secondary | ICD-10-CM | POA: Diagnosis not present

## 2016-01-04 DIAGNOSIS — R0789 Other chest pain: Secondary | ICD-10-CM | POA: Diagnosis not present

## 2016-01-04 DIAGNOSIS — Z1389 Encounter for screening for other disorder: Secondary | ICD-10-CM | POA: Diagnosis not present

## 2016-01-04 DIAGNOSIS — I10 Essential (primary) hypertension: Secondary | ICD-10-CM | POA: Diagnosis not present

## 2016-01-04 DIAGNOSIS — E876 Hypokalemia: Secondary | ICD-10-CM | POA: Diagnosis not present

## 2016-01-04 DIAGNOSIS — Z6841 Body Mass Index (BMI) 40.0 and over, adult: Secondary | ICD-10-CM | POA: Diagnosis not present

## 2016-02-04 DIAGNOSIS — E876 Hypokalemia: Secondary | ICD-10-CM | POA: Diagnosis not present

## 2016-02-04 DIAGNOSIS — Z1389 Encounter for screening for other disorder: Secondary | ICD-10-CM | POA: Diagnosis not present

## 2016-02-04 DIAGNOSIS — Z01411 Encounter for gynecological examination (general) (routine) with abnormal findings: Secondary | ICD-10-CM | POA: Diagnosis not present

## 2016-02-04 DIAGNOSIS — Z6841 Body Mass Index (BMI) 40.0 and over, adult: Secondary | ICD-10-CM | POA: Diagnosis not present

## 2016-02-22 ENCOUNTER — Ambulatory Visit (INDEPENDENT_AMBULATORY_CARE_PROVIDER_SITE_OTHER): Payer: BLUE CROSS/BLUE SHIELD | Admitting: Cardiology

## 2016-02-22 ENCOUNTER — Encounter: Payer: Self-pay | Admitting: Cardiology

## 2016-02-22 VITALS — BP 128/78 | HR 91 | Ht 65.0 in | Wt 259.0 lb

## 2016-02-22 DIAGNOSIS — R011 Cardiac murmur, unspecified: Secondary | ICD-10-CM | POA: Diagnosis not present

## 2016-02-22 DIAGNOSIS — Z8679 Personal history of other diseases of the circulatory system: Secondary | ICD-10-CM | POA: Diagnosis not present

## 2016-02-22 DIAGNOSIS — R0789 Other chest pain: Secondary | ICD-10-CM | POA: Diagnosis not present

## 2016-02-22 DIAGNOSIS — Z8719 Personal history of other diseases of the digestive system: Secondary | ICD-10-CM

## 2016-02-22 NOTE — Patient Instructions (Signed)
Your physician recommends that you schedule a follow-up appointment in: to be determined after tests   Your physician has requested that you have an exercise tolerance test. For further information please visit [email or web address]. Please also follow instruction sheet, as given.   Your physician has requested that you have an echocardiogram. Echocardiography is a painless test that uses sound waves to create images of your heart. It provides your doctor with information about the size and shape of your heart and how well your heart's chambers and valves are working. This procedure takes approximately one hour. There are no restrictions for this procedure.    Thank you for choosing [facility] !         

## 2016-02-22 NOTE — Progress Notes (Signed)
Cardiology Office Note  Date: 02/22/2016   ID: Vanessa George, DOB [birth year], MRN [id number]  PCP: Purvis Kilts, MD  Consulting Cardiologist: Rozann Lesches, MD   Chief Complaint  Patient presents with  . Chest Pain    History of Present Illness: Vanessa George is a 46 y.o. female referred for cardiology consultation by Dr. Hilma Favors. She presents for the evaluation of episodic chest discomfort. 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 She developed an episode of left arm tightness associated with upper mid scapular discomfort and left anterior chest discomfort when she was at work one day back in November of last year. She was seen in the ER at that time, cardiac markers negative for ACS and ECG nonspecific. Chest x-ray did not show any active process. She states that she had had an episode like this prior to the event in question.  Cardiac risk factors include hypertension, she was previously on HCTZ. This was stopped since November of last year. Patient's father had a heart attack at age 48. She does not smoke cigarettes.  She has had a long-standing heart murmur by report.  She tells me that she is interested in losing weight and plans to join a gym for a regular exercise plan soon.  Past Medical History:  Diagnosis Date  . Acid reflux   . Anemia   . Asthma   . Essential hypertension     Past Surgical History:  Procedure Laterality Date  . TUBAL LIGATION      Current Outpatient Prescriptions  Medication Sig Dispense Refill  . aspirin 81 MG chewable tablet Chew 324 mg by mouth once as needed.    . Multiple Vitamin (MULTIVITAMIN WITH MINERALS) TABS tablet Take 1 tablet by mouth daily.    . pantoprazole (PROTONIX) 40 MG tablet Take 40 mg by mouth daily.   0  . VENTOLIN HFA 108 (90 BASE) MCG/ACT inhaler Inhale 2 puffs into the lungs every 4 (four) hours as needed for wheezing or shortness of breath.      No current  facility-administered medications for this visit.    Allergies:  Vicodin [hydrocodone-acetaminophen]   Social History: The patient  reports that she has never smoked. She has never used smokeless tobacco. She reports that she does not drink alcohol or use drugs.   ROS:  Please see the history of present illness. Otherwise, complete review of systems is positive for none.  All other systems are reviewed and negative.   Physical Exam: VS:  BP 128/78   Pulse 91   Ht 5\' 5"  (1.651 m)   Wt 259 lb (117.5 kg)   SpO2 98%   BMI 43.10 kg/m , BMI Body mass index is 43.1 kg/m.  Wt Readings from Last 3 Encounters:  02/22/16 259 lb (117.5 kg)  12/24/15 252 lb (114.3 kg)  04/28/14 230 lb (104.3 kg)    General: Obese woman, appears comfortable at rest. HEENT: Conjunctiva and lids normal, oropharynx clear. Neck: Supple, no elevated JVP or carotid bruits, no thyromegaly. Lungs: Clear to auscultation, nonlabored breathing at rest. Cardiac: Regular rate and rhythm, no S3, 2/6 systolic murmur, no pericardial rub. Abdomen: Soft, nontender, bowel sounds present, no guarding or rebound. Extremities: No pitting edema, distal pulses 2+. Skin: Warm and dry. Musculoskeletal: No kyphosis. Neuropsychiatric: Alert and oriented x3, affect grossly appropriate.  ECG: I personally reviewed the tracing from 12/24/2015 which showed normal sinus rhythm with decreased R wave  progression and nonspecific T-wave changes.  Recent Labwork: 12/24/2015: BUN 10; Creatinine, Ser 1.08; Hemoglobin 15.7; Platelets 232; Potassium 3.2; Sodium 135 March 2017: Hemoglobin 14.4, platelets 206, BUN 6, creatinine 1.0, potassium 4.4, AST 16, ALT 18,   Other Studies Reviewed Today:  Chest x-ray 12/24/2015: FINDINGS: The heart size and mediastinal contours are within normal limits. Both lungs are clear. The visualized skeletal structures are unremarkable.  IMPRESSION: No active cardiopulmonary disease.  Assessment and  Plan:  1. Episodic atypical chest discomfort. ECG reviewed and nonspecific from ER visit November 2017 at which point cardiac markers were negative for ACS. She has a history of hypertension, also family history of CAD in her father. She would like to start a regular exercise plan and has not undergone any prior ischemic testing. We will obtain a GXT.  2. Reportedly long-standing heart murmur. Echocardiogram will be obtained.  3. History of hypertension, systolic blood pressure in the 120s today on no specific treatment. Weight loss and exercise would also be beneficial.  4. Reportedly stable reflux symptoms on proton pump inhibitor.  Current medicines were reviewed with the patient today.   Orders Placed This Encounter  Procedures  . Exercise Tolerance Test  . ECHOCARDIOGRAM COMPLETE    Disposition: Call with test results.  Signed, Satira Sark, MD, [facility] 02/22/2016 1:30 PM    [place] [facility] [address], [place], [place] [address] Phone: [phone]; Fax: [phone]

## 2016-03-02 ENCOUNTER — Inpatient Hospital Stay (HOSPITAL_COMMUNITY): Admission: RE | Admit: 2016-03-02 | Payer: BLUE CROSS/BLUE SHIELD | Source: Ambulatory Visit

## 2016-03-02 ENCOUNTER — Ambulatory Visit (HOSPITAL_COMMUNITY)
Admission: RE | Admit: 2016-03-02 | Discharge: 2016-03-02 | Disposition: A | Discharge status: Home / Self Care | Payer: BLUE CROSS/BLUE SHIELD | Source: Ambulatory Visit | Attending: Cardiology | Admitting: Cardiology

## 2016-03-02 DIAGNOSIS — I10 Essential (primary) hypertension: Secondary | ICD-10-CM | POA: Insufficient documentation

## 2016-03-02 DIAGNOSIS — I209 Angina pectoris, unspecified: Secondary | ICD-10-CM | POA: Diagnosis not present

## 2016-03-02 DIAGNOSIS — R011 Cardiac murmur, unspecified: Secondary | ICD-10-CM | POA: Diagnosis not present

## 2016-03-02 NOTE — Progress Notes (Signed)
*  PRELIMINARY RESULTS* Echocardiogram 2D Echocardiogram has been performed.  Vanessa George 03/02/2016, 11:23 AM

## 2016-03-09 ENCOUNTER — Ambulatory Visit (HOSPITAL_COMMUNITY): Payer: BLUE CROSS/BLUE SHIELD | Attending: Cardiology

## 2016-05-09 DIAGNOSIS — J329 Chronic sinusitis, unspecified: Secondary | ICD-10-CM | POA: Diagnosis not present

## 2016-05-09 DIAGNOSIS — J209 Acute bronchitis, unspecified: Secondary | ICD-10-CM | POA: Diagnosis not present

## 2016-05-09 DIAGNOSIS — J111 Influenza due to unidentified influenza virus with other respiratory manifestations: Secondary | ICD-10-CM | POA: Diagnosis not present

## 2016-06-09 DIAGNOSIS — J209 Acute bronchitis, unspecified: Secondary | ICD-10-CM | POA: Diagnosis not present

## 2016-06-09 DIAGNOSIS — Z6841 Body Mass Index (BMI) 40.0 and over, adult: Secondary | ICD-10-CM | POA: Diagnosis not present

## 2016-06-09 DIAGNOSIS — Z1389 Encounter for screening for other disorder: Secondary | ICD-10-CM | POA: Diagnosis not present

## 2017-04-12 DIAGNOSIS — R42 Dizziness and giddiness: Secondary | ICD-10-CM | POA: Diagnosis not present

## 2017-04-12 DIAGNOSIS — R404 Transient alteration of awareness: Secondary | ICD-10-CM | POA: Diagnosis not present

## 2017-04-13 ENCOUNTER — Other Ambulatory Visit: Payer: Self-pay

## 2017-04-13 ENCOUNTER — Encounter (HOSPITAL_COMMUNITY): Payer: Self-pay | Admitting: Emergency Medicine

## 2017-04-13 ENCOUNTER — Emergency Department (HOSPITAL_COMMUNITY)
Admission: EM | Admit: 2017-04-13 | Discharge: 2017-04-13 | Disposition: A | Discharge status: Home / Self Care | Payer: BLUE CROSS/BLUE SHIELD | Attending: Emergency Medicine | Admitting: Emergency Medicine

## 2017-04-13 DIAGNOSIS — Z79899 Other long term (current) drug therapy: Secondary | ICD-10-CM | POA: Diagnosis not present

## 2017-04-13 DIAGNOSIS — R42 Dizziness and giddiness: Secondary | ICD-10-CM | POA: Diagnosis not present

## 2017-04-13 DIAGNOSIS — I10 Essential (primary) hypertension: Secondary | ICD-10-CM | POA: Diagnosis not present

## 2017-04-13 DIAGNOSIS — J45909 Unspecified asthma, uncomplicated: Secondary | ICD-10-CM | POA: Insufficient documentation

## 2017-04-13 LAB — CBC WITH DIFFERENTIAL/PLATELET
Basophils Absolute: 0 10*3/uL (ref 0.0–0.1)
Basophils Relative: 0 %
EOS ABS: 0.1 10*3/uL (ref 0.0–0.7)
Eosinophils Relative: 1 %
HCT: 49.1 % — ABNORMAL HIGH (ref 36.0–46.0)
HEMOGLOBIN: 15.5 g/dL — AB (ref 12.0–15.0)
LYMPHS PCT: 17 %
Lymphs Abs: 1.4 10*3/uL (ref 0.7–4.0)
MCH: 25.5 pg — AB (ref 26.0–34.0)
MCHC: 31.6 g/dL (ref 30.0–36.0)
MCV: 80.9 fL (ref 78.0–100.0)
Monocytes Absolute: 0.5 10*3/uL (ref 0.1–1.0)
Monocytes Relative: 6 %
NEUTROS ABS: 6.2 10*3/uL (ref 1.7–7.7)
NEUTROS PCT: 76 %
Platelets: 204 10*3/uL (ref 150–400)
RBC: 6.07 MIL/uL — AB (ref 3.87–5.11)
RDW: 17.3 % — ABNORMAL HIGH (ref 11.5–15.5)
WBC: 8.1 10*3/uL (ref 4.0–10.5)

## 2017-04-13 LAB — URINALYSIS, ROUTINE W REFLEX MICROSCOPIC
BACTERIA UA: NONE SEEN
Bilirubin Urine: NEGATIVE
Glucose, UA: NEGATIVE mg/dL
KETONES UR: NEGATIVE mg/dL
Nitrite: NEGATIVE
PROTEIN: NEGATIVE mg/dL
Specific Gravity, Urine: 1.019 (ref 1.005–1.030)
pH: 5 (ref 5.0–8.0)

## 2017-04-13 LAB — COMPREHENSIVE METABOLIC PANEL
ALK PHOS: 60 U/L (ref 38–126)
ALT: 34 U/L (ref 14–54)
AST: 30 U/L (ref 15–41)
Albumin: 3.7 g/dL (ref 3.5–5.0)
Anion gap: 11 (ref 5–15)
BUN: 8 mg/dL (ref 6–20)
CALCIUM: 9.1 mg/dL (ref 8.9–10.3)
CO2: 25 mmol/L (ref 22–32)
CREATININE: 1.14 mg/dL — AB (ref 0.44–1.00)
Chloride: 96 mmol/L — ABNORMAL LOW (ref 101–111)
GFR calc non Af Amer: 56 mL/min — ABNORMAL LOW (ref >60)
GLUCOSE: 187 mg/dL — AB (ref 65–99)
Potassium: 3.7 mmol/L (ref 3.5–5.1)
SODIUM: 132 mmol/L — AB (ref 135–145)
Total Bilirubin: 1 mg/dL (ref 0.3–1.2)
Total Protein: 7.9 g/dL (ref 6.5–8.1)

## 2017-04-13 LAB — TSH: TSH: 1.296 u[IU]/mL (ref 0.350–4.500)

## 2017-04-13 MED ORDER — POTASSIUM CHLORIDE ER 10 MEQ PO TBCR: 10.0000 meq | EXTENDED_RELEASE_TABLET | Freq: Every day | ORAL | 0 refills | Status: DC

## 2017-04-13 MED ORDER — HYDROCHLOROTHIAZIDE 25 MG PO TABS: 25.0000 mg | ORAL_TABLET | Freq: Every day | ORAL | 0 refills | Status: DC

## 2017-04-13 NOTE — ED Notes (Signed)
Pt ambulatory to bathroom with steady gait and minimal assistance

## 2017-04-13 NOTE — ED Triage Notes (Signed)
Pt c/o of dizzy and weakness this morning. Pt states she felt this way yesterday due to not eating after work but got worse this morning.  Pt has HTN but does not take medications.  BP 183/79 in triage.

## 2017-04-13 NOTE — ED Provider Notes (Signed)
[facility] EMERGENCY DEPARTMENT Provider Note   CSN: [id number] Arrival date & time: 04/13/17  9030     History   Chief Complaint Chief Complaint  Patient presents with  . Dizziness    HPI Vanessa George is a 47 y.o. female.  Complaint is episodes of dizziness  HPI: Vanessa George is a 47 year old female.  Previously on hydrochlorothiazide for hypertension.  Discontinued this because of episodes of hypokalemia.  She has been off of it for several months.  Has had some high blood pressure readings recently.  Was considering starting back on her medicine.  An episode yesterday while driving where she felt lightheaded.  She had not eaten all day.  She stopped at a convenient store and got a candy bar and a coke.  She felt somewhat better.  She ate a sandwich when she got home last night.  She awakened this morning.  Went up and around this morning she had another episode where she felt tingly all over and slightly lightheaded.  Again ate and had improvement but not resolution.  She presents here.  Denies spinning sensation.  No shortness of breath, chest pain, nausea or vomiting.  His exertional symptoms.  No peripheral edema.  Past Medical History:  Diagnosis Date  . Acid reflux   . Anemia   . Asthma   . Essential hypertension     There are no active problems to display for this patient.   Past Surgical History:  Procedure Laterality Date  . TUBAL LIGATION      OB History    Gravida      Para      Term      Preterm      AB      Living  0     SAB      TAB      Ectopic      Multiple      Live Births               Home Medications    Prior to Admission medications   Medication Sig Start Date End Date Taking? Authorizing Provider  aspirin 81 MG chewable tablet Chew 324 mg by mouth once as needed.    [provider]  hydrochlorothiazide (HYDRODIURIL) 25 MG tablet Take 1 tablet (25 mg total) by mouth daily. 04/13/17   Tanna Furry, MD  Multiple Vitamin  (MULTIVITAMIN WITH MINERALS) TABS tablet Take 1 tablet by mouth daily.    [provider]  pantoprazole (PROTONIX) 40 MG tablet Take 40 mg by mouth daily.  11/09/15   [provider]  potassium chloride (K-DUR) 10 MEQ tablet Take 1 tablet (10 mEq total) by mouth daily. 04/13/17   Tanna Furry, MD  VENTOLIN HFA 108 (90 BASE) MCG/ACT inhaler Inhale 2 puffs into the lungs every 4 (four) hours as needed for wheezing or shortness of breath.  02/27/13   [provider]    Family History Family History  Problem Relation Age of Onset  . AAA (abdominal aortic aneurysm) Mother   . Diabetes Mother   . Heart attack Father        Age 59    Social History Social History   Tobacco Use  . Smoking status: Never Smoker  . Smokeless tobacco: Never Used  Substance Use Topics  . Alcohol use: No  . Drug use: No     Allergies   Vicodin [hydrocodone-acetaminophen]   Review of Systems Review of Systems  Constitutional: Negative for  appetite change, chills, diaphoresis, fatigue and fever.  HENT: Negative for mouth sores, sore throat and trouble swallowing.   Eyes: Negative for visual disturbance.  Respiratory: Negative for cough, chest tightness, shortness of breath and wheezing.   Cardiovascular: Negative for chest pain.  Gastrointestinal: Negative for abdominal distention, abdominal pain, diarrhea, nausea and vomiting.  Endocrine: Negative for polydipsia, polyphagia and polyuria.  Genitourinary: Negative for dysuria, frequency and hematuria.  Musculoskeletal: Negative for gait problem.  Skin: Negative for color change, pallor and rash.  Neurological: Positive for dizziness. Negative for syncope, light-headedness and headaches.  Hematological: Does not bruise/bleed easily.  Psychiatric/Behavioral: Negative for behavioral problems and confusion.     Physical Exam Updated Vital Signs BP (!) 154/74   Pulse 92   Temp 98.5 F (36.9 C) (Oral)   Resp 18   Ht 5\' 6"   (1.676 m)   Wt 104.3 kg (230 lb)   SpO2 98%   BMI 37.12 kg/m   Physical Exam  Constitutional: She is oriented to person, place, and time. She appears well-developed and well-nourished. No distress.  HENT:  Head: Normocephalic.  Eyes: Pupils are equal, round, and reactive to light. Conjunctivae are normal. No scleral icterus.  Neck: Normal range of motion. Neck supple. No thyromegaly present.  Cardiovascular: Normal rate and regular rhythm. Exam reveals no gallop and no friction rub.  No murmur heard. Pulmonary/Chest: Effort normal and breath sounds normal. No respiratory distress. She has no wheezes. She has no rales.  Abdominal: Soft. Bowel sounds are normal. She exhibits no distension. There is no tenderness. There is no rebound.  Musculoskeletal: Normal range of motion.  Neurological: She is alert and oriented to person, place, and time.  Skin: Skin is warm and dry. No rash noted.  Psychiatric: She has a normal mood and affect. Her behavior is normal.     ED Treatments / Results  Labs (all labs ordered are listed, but only abnormal results are displayed) Labs Reviewed  CBC WITH DIFFERENTIAL/PLATELET - Abnormal; Notable for the following components:      Result Value   RBC 6.07 (*)    Hemoglobin 15.5 (*)    HCT 49.1 (*)    MCH 25.5 (*)    RDW 17.3 (*)    All other components within normal limits  COMPREHENSIVE METABOLIC PANEL - Abnormal; Notable for the following components:   Sodium 132 (*)    Chloride 96 (*)    Glucose, Bld 187 (*)    Creatinine, Ser 1.14 (*)    GFR calc non Af Amer 56 (*)    All other components within normal limits  URINALYSIS, ROUTINE W REFLEX MICROSCOPIC - Abnormal; Notable for the following components:   APPearance HAZY (*)    Hgb urine dipstick MODERATE (*)    Leukocytes, UA TRACE (*)    Squamous Epithelial / LPF 0-5 (*)    All other components within normal limits  TSH    EKG  EKG Interpretation  Date/Time:  Thursday April 13 2017  10:12:07 EDT Ventricular Rate:  92 PR Interval:    QRS Duration: 81 QT Interval:  353 QTC Calculation: 437 R Axis:   77 Text Interpretation:  Sinus rhythm Probable left atrial enlargement Baseline wander in lead(s) V5 Confirmed by Tanna Furry [phone]) on 04/13/2017 10:29:11 AM       Radiology No results found.  Procedures Procedures (including critical care time)  Medications Ordered in ED Medications - No data to display   Initial Impression / Assessment and  Plan / ED Course  I have reviewed the triage vital signs and the nursing notes.  Pertinent labs & imaging results that were available during my care of the patient were reviewed by me and considered in my medical decision making (see chart for details).    .EKG Interpretation  Date/Time:  Thursday April 13 2017 10:12:07 EDT Ventricular Rate:  92 PR Interval:    QRS Duration: 81 QT Interval:  353 QTC Calculation: 437 R Axis:   77 Text Interpretation:  Sinus rhythm Probable left atrial enlargement Baseline wander in lead(s) V5 Confirmed by Tanna Furry [phone]) on 04/13/2017 10:29:11 AM   Reassuring studies here.  His K is 3.7.  Blood pressures were slightly high.  I do not feel this represents stroke CAD/ACS, malignant hypertension, hypertensive urgency.  We will restart her hydrochlorthiazide.  I have asked her to eat more regularly and to avoid fasting followed by large sugar loads.  Primary care follow-up.  Restart hydrochlorothiazide, low-dose potassium.  Final Clinical Impressions(s) / ED Diagnoses   Final diagnoses:  Dizziness    ED Discharge Orders        Ordered    hydrochlorothiazide (HYDRODIURIL) 25 MG tablet  Daily     04/13/17 1346    potassium chloride (K-DUR) 10 MEQ tablet  Daily     04/13/17 1346       Tanna Furry, MD 04/13/17 1434

## 2017-04-13 NOTE — Discharge Instructions (Addendum)
Restart, and continue your blood pressure medication. Low-dose potassium for daily use to avoid low potassium.

## 2017-04-15 ENCOUNTER — Emergency Department (HOSPITAL_COMMUNITY)
Admission: EM | Admit: 2017-04-15 | Discharge: 2017-04-15 | Disposition: A | Discharge status: Home / Self Care | Payer: BLUE CROSS/BLUE SHIELD | Attending: Emergency Medicine | Admitting: Emergency Medicine

## 2017-04-15 ENCOUNTER — Emergency Department (HOSPITAL_COMMUNITY): Payer: BLUE CROSS/BLUE SHIELD

## 2017-04-15 ENCOUNTER — Encounter (HOSPITAL_COMMUNITY): Payer: Self-pay | Admitting: Cardiology

## 2017-04-15 DIAGNOSIS — Z79899 Other long term (current) drug therapy: Secondary | ICD-10-CM | POA: Insufficient documentation

## 2017-04-15 DIAGNOSIS — E860 Dehydration: Secondary | ICD-10-CM | POA: Diagnosis not present

## 2017-04-15 DIAGNOSIS — Z7982 Long term (current) use of aspirin: Secondary | ICD-10-CM | POA: Diagnosis not present

## 2017-04-15 DIAGNOSIS — H81399 Other peripheral vertigo, unspecified ear: Secondary | ICD-10-CM | POA: Diagnosis not present

## 2017-04-15 DIAGNOSIS — J45909 Unspecified asthma, uncomplicated: Secondary | ICD-10-CM | POA: Insufficient documentation

## 2017-04-15 DIAGNOSIS — I10 Essential (primary) hypertension: Secondary | ICD-10-CM | POA: Insufficient documentation

## 2017-04-15 DIAGNOSIS — R42 Dizziness and giddiness: Secondary | ICD-10-CM | POA: Diagnosis not present

## 2017-04-15 LAB — COMPREHENSIVE METABOLIC PANEL
ALBUMIN: 4.1 g/dL (ref 3.5–5.0)
ALT: 41 U/L (ref 14–54)
AST: 41 U/L (ref 15–41)
Alkaline Phosphatase: 72 U/L (ref 38–126)
Anion gap: 14 (ref 5–15)
BUN: 11 mg/dL (ref 6–20)
CO2: 24 mmol/L (ref 22–32)
CREATININE: 1.24 mg/dL — AB (ref 0.44–1.00)
Calcium: 10.4 mg/dL — ABNORMAL HIGH (ref 8.9–10.3)
Chloride: 97 mmol/L — ABNORMAL LOW (ref 101–111)
GFR calc Af Amer: 59 mL/min — ABNORMAL LOW (ref >60)
GFR calc non Af Amer: 51 mL/min — ABNORMAL LOW (ref >60)
GLUCOSE: 141 mg/dL — AB (ref 65–99)
Potassium: 3.9 mmol/L (ref 3.5–5.1)
SODIUM: 135 mmol/L (ref 135–145)
Total Bilirubin: 1 mg/dL (ref 0.3–1.2)
Total Protein: 9.2 g/dL — ABNORMAL HIGH (ref 6.5–8.1)

## 2017-04-15 LAB — CBC WITH DIFFERENTIAL/PLATELET
Basophils Absolute: 0 10*3/uL (ref 0.0–0.1)
Basophils Relative: 0 %
EOS PCT: 0 %
Eosinophils Absolute: 0 10*3/uL (ref 0.0–0.7)
HEMATOCRIT: 51.3 % — AB (ref 36.0–46.0)
Hemoglobin: 16.7 g/dL — ABNORMAL HIGH (ref 12.0–15.0)
LYMPHS ABS: 2.6 10*3/uL (ref 0.7–4.0)
LYMPHS PCT: 18 %
MCH: 26.6 pg (ref 26.0–34.0)
MCHC: 32.6 g/dL (ref 30.0–36.0)
MCV: 81.7 fL (ref 78.0–100.0)
MONO ABS: 0.8 10*3/uL (ref 0.1–1.0)
Monocytes Relative: 6 %
Neutro Abs: 10.5 10*3/uL — ABNORMAL HIGH (ref 1.7–7.7)
Neutrophils Relative %: 76 %
PLATELETS: 255 10*3/uL (ref 150–400)
RBC: 6.28 MIL/uL — ABNORMAL HIGH (ref 3.87–5.11)
RDW: 15.2 % (ref 11.5–15.5)
WBC: 13.9 10*3/uL — ABNORMAL HIGH (ref 4.0–10.5)

## 2017-04-15 LAB — URINALYSIS, ROUTINE W REFLEX MICROSCOPIC
Bilirubin Urine: NEGATIVE
Glucose, UA: NEGATIVE mg/dL
KETONES UR: NEGATIVE mg/dL
LEUKOCYTES UA: NEGATIVE
Nitrite: NEGATIVE
PH: 6 (ref 5.0–8.0)
Protein, ur: NEGATIVE mg/dL
SPECIFIC GRAVITY, URINE: 1.009 (ref 1.005–1.030)

## 2017-04-15 LAB — PREGNANCY, URINE: Preg Test, Ur: NEGATIVE

## 2017-04-15 LAB — TROPONIN I: Troponin I: <0.03 ng/mL (ref <0.03)

## 2017-04-15 MED ORDER — MECLIZINE HCL 25 MG PO TABS: 25.0000 mg | ORAL_TABLET | Freq: Three times a day (TID) | ORAL | 0 refills | Status: DC | PRN

## 2017-04-15 MED ORDER — MECLIZINE HCL 12.5 MG PO TABS
25.0000 mg | ORAL_TABLET | Freq: Once | ORAL | Status: AC
Start: 2017-04-15 — End: 2017-04-15
  Administered 2017-04-15: 25 mg via ORAL
  Filled 2017-04-15: qty 2

## 2017-04-15 MED ORDER — SODIUM CHLORIDE 0.9 % IV BOLUS (SEPSIS)
1000.0000 mL | Freq: Once | INTRAVENOUS | Status: AC
  Administered 2017-04-15: 1000 mL via INTRAVENOUS

## 2017-04-15 NOTE — ED Triage Notes (Signed)
Dizziness since Wednesday.  Seen here with same on Thursday.

## 2017-04-15 NOTE — ED Notes (Signed)
Patient has ambulated back and forth to the bathroom with no issues.

## 2017-04-15 NOTE — ED Provider Notes (Signed)
[facility] EMERGENCY DEPARTMENT Provider Note   CSN: [id number] Arrival date & time: 04/15/17  1049     History   Chief Complaint Chief Complaint  Patient presents with  . Dizziness    HPI Mariah Gerstenberger is a 47 y.o. female.  HPI  Pt was seen at 1230. Per pt, c/o gradual onset and resolution of several intermittent episodes of "lightheadedness" this past week. These episodes occurred 2 and 3 days ago. Described the episodes as "lightheadedness." States she was not eating regularly at those times, and felt better after she ate high sugar food and drink. She also noted her "BP to be high" so she came to the ED for evaluation. Pt was restarted on her previous BP meds and rx potassium. Pt states today she "had a different episode." Pt states she has intermittently felt "waves" of "spinning" sensation today, with "felt flushed." She also states her sinuses have been congested recently. Denies CP/palpitations, no cough/SOB, no abd pain, no N/V/D, no fevers, no rash, no neck pain, no injury, no visual changes, no focal motor weakness, no tingling/numbness in extremities, no ataxia, no slurred speech, no facial droop.    Past Medical History:  Diagnosis Date  . Acid reflux   . Anemia   . Asthma   . Essential hypertension     There are no active problems to display for this patient.   Past Surgical History:  Procedure Laterality Date  . TUBAL LIGATION       OB History    Gravida      Para      Term      Preterm      AB      Living  0     SAB      TAB      Ectopic      Multiple      Live Births               Home Medications    Prior to Admission medications   Medication Sig Start Date End Date Taking? Authorizing Provider  aspirin 81 MG chewable tablet Chew 324 mg by mouth once as needed.    [provider]  hydrochlorothiazide (HYDRODIURIL) 25 MG tablet Take 1 tablet (25 mg total) by mouth daily. 04/13/17   Tanna Furry, MD  Multiple Vitamin  (MULTIVITAMIN WITH MINERALS) TABS tablet Take 1 tablet by mouth daily.    [provider]  pantoprazole (PROTONIX) 40 MG tablet Take 40 mg by mouth daily.  11/09/15   [provider]  potassium chloride (K-DUR) 10 MEQ tablet Take 1 tablet (10 mEq total) by mouth daily. 04/13/17   Tanna Furry, MD  VENTOLIN HFA 108 (90 BASE) MCG/ACT inhaler Inhale 2 puffs into the lungs every 4 (four) hours as needed for wheezing or shortness of breath.  02/27/13   [provider]    Family History Family History  Problem Relation Age of Onset  . AAA (abdominal aortic aneurysm) Mother   . Diabetes Mother   . Heart attack Father        Age 69    Social History Social History   Tobacco Use  . Smoking status: Never Smoker  . Smokeless tobacco: Never Used  Substance Use Topics  . Alcohol use: No  . Drug use: No     Allergies   Vicodin [hydrocodone-acetaminophen]   Review of Systems Review of Systems ROS: Statement: All systems negative except as marked or noted in  the HPI; Constitutional: Negative for fever and chills. ; ; Eyes: Negative for eye pain, redness and discharge. ; ; ENMT: Negative for ear pain, hoarseness, sore throat. +nasal congestion, sinus pressure. ; ; Cardiovascular: Negative for chest pain, palpitations, diaphoresis, dyspnea and peripheral edema. ; ; Respiratory: Negative for cough, wheezing and stridor. ; ; Gastrointestinal: Negative for nausea, vomiting, diarrhea, abdominal pain, blood in stool, hematemesis, jaundice and rectal bleeding. . ; ; Genitourinary: Negative for dysuria, flank pain and hematuria. ; ; Musculoskeletal: Negative for back pain and neck pain. Negative for swelling and trauma.; ; Skin: Negative for pruritus, rash, abrasions, blisters, bruising and skin lesion.; ; Neuro: +lightheadedness, now spinning sensation. Negative for headache and neck stiffness. Negative for weakness, altered level of consciousness, altered mental status, extremity  weakness, paresthesias, involuntary movement, seizure and syncope.       Physical Exam Updated Vital Signs BP 139/80 (BP Location: Left Arm)   Pulse 93   Temp 98 F (36.7 C) (Oral)   Resp 17   Ht 5\' 6"  (1.676 m)   Wt 104.3 kg (230 lb)   SpO2 100%   BMI 37.12 kg/m    BP 132/85   Pulse 85   Temp 98 F (36.7 C) (Oral)   Resp (!) 22   Ht 5\' 6"  (1.676 m)   Wt 104.3 kg (230 lb)   SpO2 98%   BMI 37.12 kg/m    13:10:41 Orthostatic Vital Signs SL  Orthostatic Lying   BP- Lying: 146/88   Pulse- Lying: 93       Orthostatic Sitting  BP- Sitting: 143/89   Pulse- Sitting: 92       Orthostatic Standing at 0 minutes  BP- Standing at 0 minutes: 145/101Abnormal    Pulse- Standing at 0 minutes: 106     Physical Exam 3474: Physical examination:  Nursing notes reviewed; Vital signs and O2 SAT reviewed;  Constitutional: Well developed, Well nourished, Well hydrated, In no acute distress; Head:  Normocephalic, atraumatic; Eyes: EOMI, PERRL, No scleral icterus; ENMT: TM's clear bilat. +edemetous nasal turbinates bilat with clear rhinorrhea. Mouth and pharynx without lesions. No tonsillar exudates. No intra-oral edema. No submandibular or sublingual edema. No hoarse voice, no drooling, no stridor. No pain with manipulation of larynx. No trismus. Mouth and pharynx normal, Mucous membranes moist; Neck: Supple, Full range of motion, No lymphadenopathy; Cardiovascular: Regular rate and rhythm, No gallop; Respiratory: Breath sounds clear & equal bilaterally, No wheezes.  Speaking full sentences with ease, Normal respiratory effort/excursion; Chest: Nontender, Movement normal; Abdomen: Soft, Nontender, Nondistended, Normal bowel sounds; Genitourinary: No CVA tenderness; Extremities: Peripheral pulses normal, No tenderness, No edema, No calf edema or asymmetry.; Neuro: AA&Ox3, Major CN grossly intact. No facial droop. +left horizontal end gaze fatigable nystagmus which reproduces pt's symptoms.  Speech clear. No gross focal motor or sensory deficits in extremities.; Skin: Color normal, Warm, Dry.    ED Treatments / Results  Labs (all labs ordered are listed, but only abnormal results are displayed)   EKG   Radiology   Procedures Procedures (including critical care time)  Medications Ordered in ED Medications  meclizine (ANTIVERT) tablet 25 mg (has no administration in time range)     Initial Impression / Assessment and Plan / ED Course  I have reviewed the triage vital signs and the nursing notes.  Pertinent labs & imaging results that were available during my care of the patient were reviewed by me and considered in my medical decision making (see chart for  details).  MDM Reviewed: previous chart, nursing note and vitals Reviewed previous: labs and ECG Interpretation: labs, ECG, x-ray and CT scan   ED ECG REPORT   Date: 04/15/2017  Rate: 92  Rhythm: normal sinus rhythm  QRS Axis: normal  Intervals: normal  ST/T Wave abnormalities: normal  Conduction Disutrbances:none  Narrative Interpretation:   Old EKG Reviewed: unchanged; no significant changes from previous EKG dated 04/13/2017. I have personally reviewed the EKG tracing and agree with the computerized printout as noted.   Results for orders placed or performed during the hospital encounter of 04/15/17  Comprehensive metabolic panel  Result Value Ref Range   Sodium 135 135 - 145 mmol/L   Potassium 3.9 3.5 - 5.1 mmol/L   Chloride 97 (L) 101 - 111 mmol/L   CO2 24 22 - 32 mmol/L   Glucose, Bld 141 (H) 65 - 99 mg/dL   BUN 11 6 - 20 mg/dL   Creatinine, Ser 1.24 (H) 0.44 - 1.00 mg/dL   Calcium 10.4 (H) 8.9 - 10.3 mg/dL   Total Protein 9.2 (H) 6.5 - 8.1 g/dL   Albumin 4.1 3.5 - 5.0 g/dL   AST 41 15 - 41 U/L   ALT 41 14 - 54 U/L   Alkaline Phosphatase 72 38 - 126 U/L   Total Bilirubin 1.0 0.3 - 1.2 mg/dL   GFR calc non Af Amer 51 (L) >60 mL/min   GFR calc Af Amer 59 (L) >60 mL/min   Anion gap 14  5 - 15  Troponin I  Result Value Ref Range   Troponin I <0.03 <0.03 ng/mL  CBC with Differential  Result Value Ref Range   WBC 13.9 (H) 4.0 - 10.5 K/uL   RBC 6.28 (H) 3.87 - 5.11 MIL/uL   Hemoglobin 16.7 (H) 12.0 - 15.0 g/dL   HCT 51.3 (H) 36.0 - 46.0 %   MCV 81.7 78.0 - 100.0 fL   MCH 26.6 26.0 - 34.0 pg   MCHC 32.6 30.0 - 36.0 g/dL   RDW 15.2 11.5 - 15.5 %   Platelets 255 150 - 400 K/uL   Neutrophils Relative % 76 %   Neutro Abs 10.5 (H) 1.7 - 7.7 K/uL   Lymphocytes Relative 18 %   Lymphs Abs 2.6 0.7 - 4.0 K/uL   Monocytes Relative 6 %   Monocytes Absolute 0.8 0.1 - 1.0 K/uL   Eosinophils Relative 0 %   Eosinophils Absolute 0.0 0.0 - 0.7 K/uL   Basophils Relative 0 %   Basophils Absolute 0.0 0.0 - 0.1 K/uL  Pregnancy, urine  Result Value Ref Range   Preg Test, Ur NEGATIVE NEGATIVE  Urinalysis, Routine w reflex microscopic  Result Value Ref Range   Color, Urine YELLOW YELLOW   APPearance CLEAR CLEAR   Specific Gravity, Urine 1.009 1.005 - 1.030   pH 6.0 5.0 - 8.0   Glucose, UA NEGATIVE NEGATIVE mg/dL   Hgb urine dipstick SMALL (A) NEGATIVE   Bilirubin Urine NEGATIVE NEGATIVE   Ketones, ur NEGATIVE NEGATIVE mg/dL   Protein, ur NEGATIVE NEGATIVE mg/dL   Nitrite NEGATIVE NEGATIVE   Leukocytes, UA NEGATIVE NEGATIVE   RBC / HPF 0-5 0 - 5 RBC/hpf   WBC, UA 0-5 0 - 5 WBC/hpf   Bacteria, UA RARE (A) NONE SEEN   Squamous Epithelial / LPF 0-5 (A) NONE SEEN   Dg Chest 2 View Result Date: 04/15/2017 CLINICAL DATA:  Dizziness. EXAM: CHEST - 2 VIEW COMPARISON:  12/24/2015. FINDINGS: Normal sized heart. Clear  lungs with normal vascularity. Unremarkable bones. IMPRESSION: No acute abnormality. Electronically Signed   By: Claudie Revering M.D.   On: 04/15/2017 14:14   Ct Head Wo Contrast Result Date: 04/15/2017 CLINICAL DATA:  Dizziness for the past 4 days. EXAM: CT HEAD WITHOUT CONTRAST TECHNIQUE: Contiguous axial images were obtained from the base of the skull through the vertex  without intravenous contrast. COMPARISON:  None. FINDINGS: Brain: Normal appearing cerebral hemispheres and posterior fossa structures. Normal size and position of the ventricles. No intracranial hemorrhage, mass lesion or CT evidence of acute infarction. Vascular: No hyperdense vessel or unexpected calcification. Skull: Normal. Negative for fracture or focal lesion. Sinuses/Orbits: Unremarkable. Other: None. IMPRESSION: Normal examination. Electronically Signed   By: Claudie Revering M.D.   On: 04/15/2017 14:13    1440:  Initial ED visit 2 days ago, pt described her symptoms as near syncope/lightheaded and admitted that she was not eating/drinking regularly and choosing high sugar foods after fasting. Pt was re-started on her previous BP meds (HCTZ). Today, pt describes "different" symptoms, ie: sensation of spinning and movement, and has horizontal nystagmus, c/w vertigo. Today, pt's BUN/Cr slightly elevated from previous, H/H hemeconcentrated, and pt becomes mildly tachycardic during orthostatic VS; IVF bolus given. Pt feels improved after IVF and PO antivert. Pt has tol PO well without N/V. Pt has ambulated with steady gait, easy resps, NAD. Pt states she is ready to go home now. Pt states she already has f/u appt with her PMD this Thursday. Will have pt d/c BP med (diuretic) until seen by PMD; pt will monitor BP at home, keep hydrated and eat regular meals. Dx and testing d/w pt and family.  Questions answered.  Verb understanding, agreeable to d/c home with outpt f/u.      Final Clinical Impressions(s) / ED Diagnoses   Final diagnoses:  None    ED Discharge Orders    None       Starlee, [place], DO 04/18/17 1549

## 2017-04-15 NOTE — Discharge Instructions (Addendum)
Take the prescription as directed. Hold your blood pressure medication until you are seen in follow up by your doctor this week. Take your blood pressure only once per day, either in the morning or in the evening before you go to bed.  Always sit quietly for at least 15 minutes before taking your blood pressure.  Keep a diary of your blood pressures to show your doctor at your follow up office visit. Eat regular meals and keep yourself hydrated. Avoid long periods of fasting followed by high sugary foods. Call your regular medical doctor on Monday morning to schedule a follow up appointment in the next 3 days.  Return to the Emergency Department immediately sooner if worsening.

## 2017-04-20 DIAGNOSIS — I10 Essential (primary) hypertension: Secondary | ICD-10-CM | POA: Diagnosis not present

## 2017-04-20 DIAGNOSIS — Z1389 Encounter for screening for other disorder: Secondary | ICD-10-CM | POA: Diagnosis not present

## 2017-04-20 DIAGNOSIS — R42 Dizziness and giddiness: Secondary | ICD-10-CM | POA: Diagnosis not present

## 2017-04-20 DIAGNOSIS — Z6841 Body Mass Index (BMI) 40.0 and over, adult: Secondary | ICD-10-CM | POA: Diagnosis not present

## 2017-04-28 ENCOUNTER — Encounter (HOSPITAL_COMMUNITY): Payer: Self-pay | Admitting: Cardiology

## 2017-04-28 ENCOUNTER — Emergency Department (HOSPITAL_COMMUNITY)
Admission: EM | Admit: 2017-04-28 | Discharge: 2017-04-28 | Disposition: A | Discharge status: Home / Self Care | Payer: BLUE CROSS/BLUE SHIELD | Attending: Emergency Medicine | Admitting: Emergency Medicine

## 2017-04-28 DIAGNOSIS — I10 Essential (primary) hypertension: Secondary | ICD-10-CM | POA: Insufficient documentation

## 2017-04-28 DIAGNOSIS — R82998 Other abnormal findings in urine: Secondary | ICD-10-CM | POA: Insufficient documentation

## 2017-04-28 DIAGNOSIS — Z79899 Other long term (current) drug therapy: Secondary | ICD-10-CM | POA: Insufficient documentation

## 2017-04-28 DIAGNOSIS — Z7982 Long term (current) use of aspirin: Secondary | ICD-10-CM | POA: Insufficient documentation

## 2017-04-28 DIAGNOSIS — J45909 Unspecified asthma, uncomplicated: Secondary | ICD-10-CM | POA: Diagnosis not present

## 2017-04-28 DIAGNOSIS — R42 Dizziness and giddiness: Secondary | ICD-10-CM | POA: Insufficient documentation

## 2017-04-28 LAB — URINALYSIS, ROUTINE W REFLEX MICROSCOPIC
Bacteria, UA: NONE SEEN
Bilirubin Urine: NEGATIVE
Glucose, UA: NEGATIVE mg/dL
Ketones, ur: NEGATIVE mg/dL
Nitrite: NEGATIVE
Protein, ur: NEGATIVE mg/dL
Specific Gravity, Urine: 1.009 (ref 1.005–1.030)
pH: 9 — ABNORMAL HIGH (ref 5.0–8.0)

## 2017-04-28 LAB — BASIC METABOLIC PANEL
Anion gap: 11 (ref 5–15)
BUN: 7 mg/dL (ref 6–20)
CO2: 26 mmol/L (ref 22–32)
Calcium: 9.3 mg/dL (ref 8.9–10.3)
Chloride: 103 mmol/L (ref 101–111)
Creatinine, Ser: 0.97 mg/dL (ref 0.44–1.00)
GFR calc Af Amer: >60 mL/min (ref >60)
GFR calc non Af Amer: >60 mL/min (ref >60)
Glucose, Bld: 102 mg/dL — ABNORMAL HIGH (ref 65–99)
Potassium: 4.6 mmol/L (ref 3.5–5.1)
Sodium: 140 mmol/L (ref 135–145)

## 2017-04-28 LAB — CBG MONITORING, ED: GLUCOSE-CAPILLARY: 104 mg/dL — AB (ref 65–99)

## 2017-04-28 NOTE — ED Triage Notes (Signed)
C/o being light headed and dizzy.  C/o same symptoms times one month.  Seen here with same and has followed up with PCP.

## 2017-04-30 LAB — URINE CULTURE

## 2017-05-02 NOTE — ED Provider Notes (Signed)
[facility] EMERGENCY DEPARTMENT Provider Note   CSN: [id number] Arrival date & time: 04/28/17  1038     History   Chief Complaint Chief Complaint  Patient presents with  . Dizziness    HPI Vanessa George is a 47 y.o. female.  HPI   47 year old female with a complaints of feeling dizzy/lightheaded.  The symptoms have been intermittent for about a month.  She is seen in the emergency room and by her PCP for the same.  No clear etiology.  The symptoms seemingly randomly.  She can have them at rest, while active or simply laying in bed.  States that she just feels lightheaded and is generally not like her normal self.  Past Medical History:  Diagnosis Date  . Acid reflux   . Anemia   . Asthma   . Essential hypertension     There are no active problems to display for this patient.   Past Surgical History:  Procedure Laterality Date  . TUBAL LIGATION       OB History    Gravida      Para      Term      Preterm      AB      Living  0     SAB      TAB      Ectopic      Multiple      Live Births               Home Medications    Prior to Admission medications   Medication Sig Start Date End Date Taking? Authorizing Provider  aspirin 81 MG chewable tablet Chew 324 mg by mouth once as needed.   Yes [provider]  lisinopril (PRINIVIL,ZESTRIL) 10 MG tablet Take 1 tablet by mouth daily. 04/20/17  Yes [provider]  pantoprazole (PROTONIX) 40 MG tablet Take 40 mg by mouth daily.  11/09/15  Yes [provider]  VENTOLIN HFA 108 (90 BASE) MCG/ACT inhaler Inhale 2 puffs into the lungs every 4 (four) hours as needed for wheezing or shortness of breath.  02/27/13  Yes [provider]  meclizine (ANTIVERT) 25 MG tablet Take 1 tablet (25 mg total) by mouth 3 (three) times daily as needed for dizziness. Patient not taking: Reported on 04/28/2017 04/15/17   Francine Graven, DO    Family History Family History  Problem Relation  Age of Onset  . AAA (abdominal aortic aneurysm) Mother   . Diabetes Mother   . Heart attack Father        Age 43    Social History Social History   Tobacco Use  . Smoking status: Never Smoker  . Smokeless tobacco: Never Used  Substance Use Topics  . Alcohol use: No  . Drug use: No     Allergies   Vicodin [hydrocodone-acetaminophen]   Review of Systems Review of Systems  All systems reviewed and negative, other than as noted in HPI.  Physical Exam Updated Vital Signs BP (!) 146/90 (BP Location: Left Arm)   Pulse 83   Temp 98.3 F (36.8 C) (Oral)   Resp 17   Ht 5\' 6"  (1.676 m)   Wt 104.3 kg (230 lb)   SpO2 95%   BMI 37.12 kg/m   Physical Exam  Constitutional: She appears well-developed and well-nourished. No distress.  HENT:  Head: Normocephalic and atraumatic.  Eyes: Conjunctivae are normal. Right eye exhibits no discharge. Left eye exhibits no discharge.  Neck:  Neck supple.  Cardiovascular: Normal rate, regular rhythm and normal heart sounds. Exam reveals no gallop and no friction rub.  No murmur heard. Pulmonary/Chest: Effort normal and breath sounds normal. No respiratory distress.  Abdominal: Soft. She exhibits no distension. There is no tenderness.  Musculoskeletal: She exhibits no edema or tenderness.  Neurological: She is alert.  Skin: Skin is warm and dry.  Psychiatric: She has a normal mood and affect. Her behavior is normal. Thought content normal.  Nursing note and vitals reviewed.    ED Treatments / Results  Labs (all labs ordered are listed, but only abnormal results are displayed) Labs Reviewed  URINE CULTURE - Abnormal; Notable for the following components:      Result Value   Culture MULTIPLE SPECIES PRESENT, SUGGEST RECOLLECTION (*)    All other components within normal limits  URINALYSIS, ROUTINE W REFLEX MICROSCOPIC - Abnormal; Notable for the following components:   Color, Urine STRAW (*)    pH 9.0 (*)    Hgb urine dipstick  SMALL (*)    Leukocytes, UA TRACE (*)    Squamous Epithelial / LPF 0-5 (*)    All other components within normal limits  BASIC METABOLIC PANEL - Abnormal; Notable for the following components:   Glucose, Bld 102 (*)    All other components within normal limits  CBG MONITORING, ED - Abnormal; Notable for the following components:   Glucose-Capillary 104 (*)    All other components within normal limits    EKG None  Radiology No results found.  Procedures Procedures (including critical care time)  Medications Ordered in ED Medications - No data to display   Initial Impression / Assessment and Plan / ED Course  I have reviewed the triage vital signs and the nursing notes.  Pertinent labs & imaging results that were available during my care of the patient were reviewed by me and considered in my medical decision making (see chart for details).     47 year old female with multiple weeks of vague sensation of dizziness.  Several evaluations for the same.  No acute change in her symptoms today.  I have a low suspicion for emergent process.  Try to reassure her.  She keeps perseverating on her blood pressure and her blood sugar although I think neither are the underlying etiology of her symptoms.  Advised to take your medicines as prescribed.  Advised to keep a log of her blood pressure and take it to her next PCP visit.  Final Clinical Impressions(s) / ED Diagnoses   Final diagnoses:  Dizziness    ED Discharge Orders    None       Virgel Manifold, MD 05/02/17 [phone]

## 2017-05-18 DIAGNOSIS — F4320 Adjustment disorder, unspecified: Secondary | ICD-10-CM | POA: Diagnosis not present

## 2017-05-18 DIAGNOSIS — Z6841 Body Mass Index (BMI) 40.0 and over, adult: Secondary | ICD-10-CM | POA: Diagnosis not present

## 2017-06-16 DIAGNOSIS — Z23 Encounter for immunization: Secondary | ICD-10-CM | POA: Diagnosis not present

## 2017-06-16 DIAGNOSIS — Z6841 Body Mass Index (BMI) 40.0 and over, adult: Secondary | ICD-10-CM | POA: Diagnosis not present

## 2017-06-16 DIAGNOSIS — Z1389 Encounter for screening for other disorder: Secondary | ICD-10-CM | POA: Diagnosis not present

## 2017-06-16 DIAGNOSIS — F4320 Adjustment disorder, unspecified: Secondary | ICD-10-CM | POA: Diagnosis not present

## 2017-08-10 ENCOUNTER — Encounter (HOSPITAL_COMMUNITY): Payer: Self-pay | Admitting: Emergency Medicine

## 2017-08-10 ENCOUNTER — Emergency Department (HOSPITAL_COMMUNITY)
Admission: EM | Admit: 2017-08-10 | Discharge: 2017-08-10 | Disposition: A | Discharge status: Home / Self Care | Payer: BLUE CROSS/BLUE SHIELD | Attending: Emergency Medicine | Admitting: Emergency Medicine

## 2017-08-10 ENCOUNTER — Emergency Department (HOSPITAL_COMMUNITY): Payer: BLUE CROSS/BLUE SHIELD

## 2017-08-10 ENCOUNTER — Other Ambulatory Visit: Payer: Self-pay

## 2017-08-10 DIAGNOSIS — J45909 Unspecified asthma, uncomplicated: Secondary | ICD-10-CM | POA: Diagnosis not present

## 2017-08-10 DIAGNOSIS — I10 Essential (primary) hypertension: Secondary | ICD-10-CM | POA: Insufficient documentation

## 2017-08-10 DIAGNOSIS — R079 Chest pain, unspecified: Secondary | ICD-10-CM | POA: Diagnosis not present

## 2017-08-10 DIAGNOSIS — R0789 Other chest pain: Secondary | ICD-10-CM | POA: Diagnosis not present

## 2017-08-10 DIAGNOSIS — Z7982 Long term (current) use of aspirin: Secondary | ICD-10-CM | POA: Insufficient documentation

## 2017-08-10 DIAGNOSIS — Z79899 Other long term (current) drug therapy: Secondary | ICD-10-CM | POA: Insufficient documentation

## 2017-08-10 HISTORY — DX: Depression, unspecified: F32.A

## 2017-08-10 HISTORY — DX: Major depressive disorder, single episode, unspecified: F32.9

## 2017-08-10 LAB — BASIC METABOLIC PANEL
ANION GAP: 7 (ref 5–15)
BUN: 10 mg/dL (ref 6–20)
CALCIUM: 9.1 mg/dL (ref 8.9–10.3)
CO2: 30 mmol/L (ref 22–32)
CREATININE: 1.12 mg/dL — AB (ref 0.44–1.00)
Chloride: 100 mmol/L (ref 98–111)
GFR calc Af Amer: >60 mL/min (ref >60)
GFR calc non Af Amer: 57 mL/min — ABNORMAL LOW (ref >60)
GLUCOSE: 95 mg/dL (ref 70–99)
Potassium: 4 mmol/L (ref 3.5–5.1)
Sodium: 137 mmol/L (ref 135–145)

## 2017-08-10 LAB — TROPONIN I
Troponin I: <0.03 ng/mL (ref <0.03)
Troponin I: <0.03 ng/mL (ref <0.03)

## 2017-08-10 LAB — DIFFERENTIAL
Basophils Absolute: 0 10*3/uL (ref 0.0–0.1)
Basophils Relative: 0 %
EOS PCT: 2 %
Eosinophils Absolute: 0.2 10*3/uL (ref 0.0–0.7)
Lymphocytes Relative: 18 %
Lymphs Abs: 2.4 10*3/uL (ref 0.7–4.0)
Monocytes Absolute: 1 10*3/uL (ref 0.1–1.0)
Monocytes Relative: 8 %
NEUTROS PCT: 72 %
Neutro Abs: 9.8 10*3/uL — ABNORMAL HIGH (ref 1.7–7.7)

## 2017-08-10 LAB — I-STAT TROPONIN, ED: TROPONIN I, POC: 0 ng/mL (ref 0.00–0.08)

## 2017-08-10 LAB — CBC
HCT: 47.6 % — ABNORMAL HIGH (ref 36.0–46.0)
HEMOGLOBIN: 15.4 g/dL — AB (ref 12.0–15.0)
MCH: 27.1 pg (ref 26.0–34.0)
MCHC: 32.4 g/dL (ref 30.0–36.0)
MCV: 83.7 fL (ref 78.0–100.0)
Platelets: 235 10*3/uL (ref 150–400)
RBC: 5.69 MIL/uL — ABNORMAL HIGH (ref 3.87–5.11)
RDW: 15.7 % — ABNORMAL HIGH (ref 11.5–15.5)
WBC: 12.9 10*3/uL — ABNORMAL HIGH (ref 4.0–10.5)

## 2017-08-10 LAB — D-DIMER, QUANTITATIVE: D-Dimer, Quant: <0.27 ug/mL-FEU (ref 0.00–0.50)

## 2017-08-10 NOTE — ED Triage Notes (Signed)
CP in center of chest that radiates to lt arm on and off since yesterday.   Took 1 baby asa

## 2017-08-10 NOTE — ED Provider Notes (Signed)
  Physical Exam  BP 135/66   Pulse 88   Temp 98.1 F (36.7 C) (Oral)   Resp (!) 23   Ht 5\' 5"  (1.651 m)   Wt 117.9 kg (260 lb)   LMP 08/10/2017   SpO2 98%   BMI 43.27 kg/m   Physical Exam  ED Course/Procedures     Procedures  MDM  Second troponin is negative.  Will follow up with both her primary care doctor and her cardiologist.  Discharge home.       Davonna Belling, MD 08/10/17 1755

## 2017-08-10 NOTE — ED Provider Notes (Signed)
[facility] EMERGENCY DEPARTMENT Provider Note   CSN: [id number] Arrival date & time: 08/10/17  1155     History   Chief Complaint Chief Complaint  Patient presents with  . Chest Pain    HPI Vanessa George is a 47 y.o. female.  HPI Pt was seen at 1215. Per pt, c/o gradual onset and resolution of 2 separate episodes of left sided chest "pain" since yesterday. Last episode was at 11am today. Pt describes the pain as "squeezing," with radiation into her left arm. Pt states the pain lasts approximately 5-10 minutes. Pt states she also "chews on an aspirin" when the pain occurs. Pt endorses hx of similar symptoms, and was referred to Cardiologist. Pt states she had a normal echocardiogram, but cancelled her stress test appointment.  Denies palpitations, no SOB/cough, no abd pain, no N/V/D, no back pain, no fevers, no rash, no injury.   Past Medical History:  Diagnosis Date  . Acid reflux   . Anemia   . Asthma   . Depression   . Essential hypertension     There are no active problems to display for this patient.   Past Surgical History:  Procedure Laterality Date  . TUBAL LIGATION       OB History    Gravida      Para      Term      Preterm      AB      Living  0     SAB      TAB      Ectopic      Multiple      Live Births               Home Medications    Prior to Admission medications   Medication Sig Start Date End Date Taking? Authorizing Provider  aspirin 81 MG chewable tablet Chew 324 mg by mouth once as needed.    [provider]  lisinopril (PRINIVIL,ZESTRIL) 10 MG tablet Take 1 tablet by mouth daily. 04/20/17   [provider]  meclizine (ANTIVERT) 25 MG tablet Take 1 tablet (25 mg total) by mouth 3 (three) times daily as needed for dizziness. Patient not taking: Reported on 04/28/2017 04/15/17   Francine Graven, DO  pantoprazole (PROTONIX) 40 MG tablet Take 40 mg by mouth daily.  11/09/15   [provider]  VENTOLIN  HFA 108 (90 BASE) MCG/ACT inhaler Inhale 2 puffs into the lungs every 4 (four) hours as needed for wheezing or shortness of breath.  02/27/13   [provider]    Family History Family History  Problem Relation Age of Onset  . AAA (abdominal aortic aneurysm) Mother   . Diabetes Mother   . Heart attack Father        Age 51    Social History Social History   Tobacco Use  . Smoking status: Never Smoker  . Smokeless tobacco: Never Used  Substance Use Topics  . Alcohol use: No  . Drug use: No     Allergies   Vicodin [hydrocodone-acetaminophen]   Review of Systems Review of Systems ROS: Statement: All systems negative except as marked or noted in the HPI; Constitutional: Negative for fever and chills. ; ; Eyes: Negative for eye pain, redness and discharge. ; ; ENMT: Negative for ear pain, hoarseness, nasal congestion, sinus pressure and sore throat. ; ; Cardiovascular: Negative for palpitations, diaphoresis, dyspnea and peripheral edema. ; ; Respiratory: Negative for cough, wheezing and stridor. ; ;  Gastrointestinal: Negative for nausea, vomiting, diarrhea, abdominal pain, blood in stool, hematemesis, jaundice and rectal bleeding. . ; ; Genitourinary: Negative for dysuria, flank pain and hematuria. ; ; Musculoskeletal: +chest pain. Negative for back pain and neck pain. Negative for swelling and trauma.; ; Skin: Negative for pruritus, rash, abrasions, blisters, bruising and skin lesion.; ; Neuro: Negative for headache, lightheadedness and neck stiffness. Negative for weakness, altered level of consciousness, altered mental status, extremity weakness, paresthesias, involuntary movement, seizure and syncope.       Physical Exam Updated Vital Signs BP 135/66   Pulse 88   Temp 98.1 F (36.7 C) (Oral)   Resp 18   Ht 5\' 5"  (1.651 m)   Wt 117.9 kg (260 lb)   LMP 08/10/2017   SpO2 98%   BMI 43.27 kg/m   Physical Exam 1220: Physical examination:  Nursing notes reviewed;  Vital signs and O2 SAT reviewed;  Constitutional: Well developed, Well nourished, Well hydrated, In no acute distress; Head:  Normocephalic, atraumatic; Eyes: EOMI, PERRL, No scleral icterus; ENMT: Mouth and pharynx normal, Mucous membranes moist; Neck: Supple, Full range of motion, No lymphadenopathy; Cardiovascular: Regular rate and rhythm, No gallop; Respiratory: Breath sounds clear & equal bilaterally, No wheezes.  Speaking full sentences with ease, Normal respiratory effort/excursion; Chest: Nontender, Movement normal; Abdomen: Soft, Nontender, Nondistended, Normal bowel sounds; Genitourinary: No CVA tenderness; Extremities: Peripheral pulses normal, No tenderness, No edema, No calf edema or asymmetry.; Neuro: AA&Ox3, Major CN grossly intact.  Speech clear. No gross focal motor or sensory deficits in extremities.; Skin: Color normal, Warm, Dry.   ED Treatments / Results  Labs (all labs ordered are listed, but only abnormal results are displayed)   EKG EKG Interpretation  Date/Time:  Thursday August 10 2017 12:04:19 EDT Ventricular Rate:  106 PR Interval:    QRS Duration: 88 QT Interval:  322 QTC Calculation: 428 R Axis:   81 Text Interpretation:  Sinus tachycardia Borderline T abnormalities, inferior leads When compared with ECG of 04/28/2017 No significant change was found Confirmed by Francine Graven [id number]) on 08/10/2017 12:43:02 PM   Radiology   Procedures Procedures (including critical care time)  Medications Ordered in ED Medications - No data to display   Initial Impression / Assessment and Plan / ED Course  I have reviewed the triage vital signs and the nursing notes.  Pertinent labs & imaging results that were available during my care of the patient were reviewed by me and considered in my medical decision making (see chart for details).  MDM Reviewed: previous chart, nursing note and vitals Reviewed previous: labs and ECG Interpretation: labs, ECG and  x-ray    Results for orders placed or performed during the hospital encounter of 73/41/93  Basic metabolic panel  Result Value Ref Range   Sodium 137 135 - 145 mmol/L   Potassium 4.0 3.5 - 5.1 mmol/L   Chloride 100 98 - 111 mmol/L   CO2 30 22 - 32 mmol/L   Glucose, Bld 95 70 - 99 mg/dL   BUN 10 6 - 20 mg/dL   Creatinine, Ser 1.12 (H) 0.44 - 1.00 mg/dL   Calcium 9.1 8.9 - 10.3 mg/dL   GFR calc non Af Amer 57 (L) >60 mL/min   GFR calc Af Amer >60 >60 mL/min   Anion gap 7 5 - 15  CBC  Result Value Ref Range   WBC 12.9 (H) 4.0 - 10.5 K/uL   RBC 5.69 (H) 3.87 - 5.11 MIL/uL   Hemoglobin  15.4 (H) 12.0 - 15.0 g/dL   HCT 47.6 (H) 36.0 - 46.0 %   MCV 83.7 78.0 - 100.0 fL   MCH 27.1 26.0 - 34.0 pg   MCHC 32.4 30.0 - 36.0 g/dL   RDW 15.7 (H) 11.5 - 15.5 %   Platelets 235 150 - 400 K/uL  D-dimer, quantitative  Result Value Ref Range   D-Dimer, Quant <0.27 0.00 - 0.50 ug/mL-FEU  Troponin I  Result Value Ref Range   Troponin I <0.03 <0.03 ng/mL  Differential  Result Value Ref Range   Neutrophils Relative % 72 %   Neutro Abs 9.8 (H) 1.7 - 7.7 K/uL   Lymphocytes Relative 18 %   Lymphs Abs 2.4 0.7 - 4.0 K/uL   Monocytes Relative 8 %   Monocytes Absolute 1.0 0.1 - 1.0 K/uL   Eosinophils Relative 2 %   Eosinophils Absolute 0.2 0.0 - 0.7 K/uL   Basophils Relative 0 %   Basophils Absolute 0.0 0.0 - 0.1 K/uL  I-stat troponin, ED  Result Value Ref Range   Troponin i, poc 0.00 0.00 - 0.08 ng/mL   Comment 3           Dg Chest 2 View Result Date: 08/10/2017 CLINICAL DATA:  Mid chest pain EXAM: CHEST - 2 VIEW COMPARISON:  04/15/2017 FINDINGS: Heart and mediastinal contours are within normal limits. No focal opacities or effusions. No acute bony abnormality. IMPRESSION: No active cardiopulmonary disease. Electronically Signed   By: Rolm Baptise M.D.   On: 08/10/2017 13:36    1550:  Workup reassuring. Hx of same symptoms, did not f/u with Cards MD. Pending 2nd trop at 5pm (6 hours after  last pain episode). Sign out to Dr. Alvino Chapel.    Final Clinical Impressions(s) / ED Diagnoses   Final diagnoses:  None    ED Discharge Orders    None       Elea, [place], DO 08/10/17 1550

## 2017-08-10 NOTE — ED Notes (Signed)
Patient given water, crackers and peanut butter.

## 2017-08-18 DIAGNOSIS — Z1389 Encounter for screening for other disorder: Secondary | ICD-10-CM | POA: Diagnosis not present

## 2017-08-18 DIAGNOSIS — Z6841 Body Mass Index (BMI) 40.0 and over, adult: Secondary | ICD-10-CM | POA: Diagnosis not present

## 2017-08-18 DIAGNOSIS — Z0001 Encounter for general adult medical examination with abnormal findings: Secondary | ICD-10-CM | POA: Diagnosis not present

## 2017-08-18 DIAGNOSIS — F4320 Adjustment disorder, unspecified: Secondary | ICD-10-CM | POA: Diagnosis not present

## 2017-08-21 DIAGNOSIS — I10 Essential (primary) hypertension: Secondary | ICD-10-CM | POA: Diagnosis not present

## 2017-08-21 DIAGNOSIS — E7849 Other hyperlipidemia: Secondary | ICD-10-CM | POA: Diagnosis not present

## 2017-08-21 DIAGNOSIS — R739 Hyperglycemia, unspecified: Secondary | ICD-10-CM | POA: Diagnosis not present

## 2017-09-29 DIAGNOSIS — R7989 Other specified abnormal findings of blood chemistry: Secondary | ICD-10-CM | POA: Diagnosis not present

## 2017-09-29 DIAGNOSIS — Z1389 Encounter for screening for other disorder: Secondary | ICD-10-CM | POA: Diagnosis not present

## 2017-09-29 DIAGNOSIS — Z6841 Body Mass Index (BMI) 40.0 and over, adult: Secondary | ICD-10-CM | POA: Diagnosis not present

## 2017-09-29 DIAGNOSIS — R740 Nonspecific elevation of levels of transaminase and lactic acid dehydrogenase [LDH]: Secondary | ICD-10-CM | POA: Diagnosis not present

## 2017-10-11 ENCOUNTER — Other Ambulatory Visit (HOSPITAL_COMMUNITY): Payer: Self-pay | Admitting: Family Medicine

## 2017-10-11 DIAGNOSIS — Z1239 Encounter for other screening for malignant neoplasm of breast: Secondary | ICD-10-CM

## 2017-10-19 ENCOUNTER — Encounter (HOSPITAL_COMMUNITY): Payer: Self-pay

## 2017-10-19 ENCOUNTER — Ambulatory Visit (HOSPITAL_COMMUNITY)
Admission: RE | Admit: 2017-10-19 | Discharge: 2017-10-19 | Disposition: A | Discharge status: Home / Self Care | Payer: BLUE CROSS/BLUE SHIELD | Source: Ambulatory Visit | Attending: Family Medicine | Admitting: Family Medicine

## 2017-10-19 DIAGNOSIS — Z1239 Encounter for other screening for malignant neoplasm of breast: Secondary | ICD-10-CM

## 2017-10-19 DIAGNOSIS — Z1231 Encounter for screening mammogram for malignant neoplasm of breast: Secondary | ICD-10-CM | POA: Diagnosis not present

## 2017-10-29 ENCOUNTER — Emergency Department (HOSPITAL_COMMUNITY)
Admission: EM | Admit: 2017-10-29 | Discharge: 2017-10-29 | Disposition: A | Discharge status: Home / Self Care | Payer: BLUE CROSS/BLUE SHIELD | Attending: Emergency Medicine | Admitting: Emergency Medicine

## 2017-10-29 ENCOUNTER — Encounter (HOSPITAL_COMMUNITY): Payer: Self-pay | Admitting: Emergency Medicine

## 2017-10-29 ENCOUNTER — Emergency Department (HOSPITAL_COMMUNITY): Payer: BLUE CROSS/BLUE SHIELD

## 2017-10-29 ENCOUNTER — Other Ambulatory Visit: Payer: Self-pay

## 2017-10-29 DIAGNOSIS — I10 Essential (primary) hypertension: Secondary | ICD-10-CM | POA: Insufficient documentation

## 2017-10-29 DIAGNOSIS — R1084 Generalized abdominal pain: Secondary | ICD-10-CM | POA: Diagnosis not present

## 2017-10-29 DIAGNOSIS — J45909 Unspecified asthma, uncomplicated: Secondary | ICD-10-CM | POA: Diagnosis not present

## 2017-10-29 DIAGNOSIS — Z79899 Other long term (current) drug therapy: Secondary | ICD-10-CM | POA: Insufficient documentation

## 2017-10-29 DIAGNOSIS — Z7982 Long term (current) use of aspirin: Secondary | ICD-10-CM | POA: Insufficient documentation

## 2017-10-29 DIAGNOSIS — R109 Unspecified abdominal pain: Secondary | ICD-10-CM | POA: Diagnosis not present

## 2017-10-29 LAB — CBC WITH DIFFERENTIAL/PLATELET
BASOS ABS: 0 10*3/uL (ref 0.0–0.1)
Basophils Relative: 0 %
EOS ABS: 0.2 10*3/uL (ref 0.0–0.7)
EOS PCT: 2 %
HCT: 46.3 % — ABNORMAL HIGH (ref 36.0–46.0)
Hemoglobin: 15.2 g/dL — ABNORMAL HIGH (ref 12.0–15.0)
LYMPHS ABS: 2.7 10*3/uL (ref 0.7–4.0)
LYMPHS PCT: 21 %
MCH: 27.5 pg (ref 26.0–34.0)
MCHC: 32.8 g/dL (ref 30.0–36.0)
MCV: 83.9 fL (ref 78.0–100.0)
MONO ABS: 0.7 10*3/uL (ref 0.1–1.0)
Monocytes Relative: 6 %
Neutro Abs: 8.8 10*3/uL — ABNORMAL HIGH (ref 1.7–7.7)
Neutrophils Relative %: 71 %
PLATELETS: 223 10*3/uL (ref 150–400)
RBC: 5.52 MIL/uL — ABNORMAL HIGH (ref 3.87–5.11)
RDW: 15.7 % — AB (ref 11.5–15.5)
WBC: 12.4 10*3/uL — AB (ref 4.0–10.5)

## 2017-10-29 LAB — URINALYSIS, ROUTINE W REFLEX MICROSCOPIC
Bacteria, UA: NONE SEEN
Bilirubin Urine: NEGATIVE
Glucose, UA: NEGATIVE mg/dL
HGB URINE DIPSTICK: NEGATIVE
Ketones, ur: NEGATIVE mg/dL
Nitrite: NEGATIVE
Protein, ur: NEGATIVE mg/dL
SPECIFIC GRAVITY, URINE: 1.014 (ref 1.005–1.030)
pH: 5 (ref 5.0–8.0)

## 2017-10-29 LAB — COMPREHENSIVE METABOLIC PANEL
ALBUMIN: 3.7 g/dL (ref 3.5–5.0)
ALK PHOS: 55 U/L (ref 38–126)
ALT: 35 U/L (ref 0–44)
AST: 25 U/L (ref 15–41)
Anion gap: 8 (ref 5–15)
BUN: 14 mg/dL (ref 6–20)
CALCIUM: 9.1 mg/dL (ref 8.9–10.3)
CHLORIDE: 101 mmol/L (ref 98–111)
CO2: 27 mmol/L (ref 22–32)
CREATININE: 1.12 mg/dL — AB (ref 0.44–1.00)
GFR, EST NON AFRICAN AMERICAN: 57 mL/min — AB (ref >60)
Glucose, Bld: 110 mg/dL — ABNORMAL HIGH (ref 70–99)
Potassium: 4.2 mmol/L (ref 3.5–5.1)
SODIUM: 136 mmol/L (ref 135–145)
Total Bilirubin: 0.2 mg/dL — ABNORMAL LOW (ref 0.3–1.2)
Total Protein: 7.7 g/dL (ref 6.5–8.1)

## 2017-10-29 LAB — D-DIMER, QUANTITATIVE: D-Dimer, Quant: 0.4 ug/mL-FEU (ref 0.00–0.50)

## 2017-10-29 LAB — CK: CK TOTAL: 146 U/L (ref 38–234)

## 2017-10-29 LAB — LIPASE, BLOOD: LIPASE: 32 U/L (ref 11–51)

## 2017-10-29 LAB — PREGNANCY, URINE: PREG TEST UR: NEGATIVE

## 2017-10-29 NOTE — ED Triage Notes (Signed)
Patient c/o right flank pain that radiates into back. Denies any nausea, vomiting, diarrhea, fevers, or urinary symptoms. Per patient started x2 hours ago.

## 2017-10-29 NOTE — Discharge Instructions (Signed)
On your CT scan, there is no obvious cause for your symptoms.  However, it shows an abnormal right ovary versus a fibroid of your uterus.  This needs to be followed up as an outpatient with an ultrasound, which your primary care physician can order.    Otherwise, if you develop worsening or severe flank pain, back pain, abdominal pain, nausea, vomiting, fevers, or any other new/concerning symptoms and return to the ER for evaluation.

## 2017-10-29 NOTE — ED Provider Notes (Signed)
[facility] EMERGENCY DEPARTMENT Provider Note   CSN: [id number] Arrival date & time: 10/29/17  1706     History   Chief Complaint Chief Complaint  Patient presents with  . Flank Pain    HPI Vanessa George is a 47 y.o. female.  HPI  47 year old female presents with right flank pain that radiates to her right back.  Started all of a sudden about an hour prior to arrival.  She was visiting a family member in the hospital when it started.  Nothing seemed to obviously induce it.  Has been constant since it started but seems to be improving.  Pain is better, about a 5 out of 10.  No nausea, vomiting, fever, chest pain or shortness of breath.  Pain is not pleuritic.  Nothing seems to make the pain better or worse.  No abdominal pain, diarrhea, constipation, urinary or vaginal symptoms.  Patient is currently going through menopause.  She had some intermittent vaginal bleeding but none recently.  She is concerned about a possible blood clot because she states her bilateral legs have been heavy recently.  She states they been painful throughout her entire legs.  She has not noticed any swelling.  No recent travel or surgery.  Past Medical History:  Diagnosis Date  . Acid reflux   . Anemia   . Asthma   . Depression   . Essential hypertension     There are no active problems to display for this patient.   Past Surgical History:  Procedure Laterality Date  . TUBAL LIGATION       OB History    Gravida      Para      Term      Preterm      AB      Living  0     SAB      TAB      Ectopic      Multiple      Live Births               Home Medications    Prior to Admission medications   Medication Sig Start Date End Date Taking? Authorizing Provider  ALPRAZolam Duanne Moron) 0.5 MG tablet Take 1 tablet by mouth 3 (three) times daily as needed for anxiety.  06/16/17  Yes [provider]  aspirin 81 MG chewable tablet Chew 81 mg by mouth daily.    Yes [provider]  BREO ELLIPTA 100-25 MCG/INH AEPB Take 1 puff by mouth daily. 07/17/17  Yes [provider]  citalopram (CELEXA) 10 MG tablet Take 10 mg by mouth daily as needed (anxiety).  07/13/17  Yes [provider]  losartan (COZAAR) 25 MG tablet Take 25 mg by mouth daily. 10/05/17  Yes [provider]  Multiple Vitamin (MULTIVITAMIN WITH MINERALS) TABS tablet Take 1 tablet by mouth daily.   Yes [provider]  pantoprazole (PROTONIX) 40 MG tablet Take 40 mg by mouth daily.  11/09/15  Yes [provider]  VENTOLIN HFA 108 (90 BASE) MCG/ACT inhaler Inhale 2 puffs into the lungs every 4 (four) hours as needed for wheezing or shortness of breath.  02/27/13  Yes [provider]  vitamin B-12 (CYANOCOBALAMIN) 50 MCG tablet Take 50 mcg by mouth daily.   Yes [provider]    Family History Family History  Problem Relation Age of Onset  . AAA (abdominal aortic aneurysm) Mother   . Diabetes Mother   . Heart attack  Father        Age 59    Social History Social History   Tobacco Use  . Smoking status: Never Smoker  . Smokeless tobacco: Never Used  Substance Use Topics  . Alcohol use: No  . Drug use: No     Allergies   Vicodin [hydrocodone-acetaminophen]   Review of Systems Review of Systems  Constitutional: Negative for fever.  Respiratory: Negative for cough and shortness of breath.   Cardiovascular: Negative for chest pain and leg swelling.  Gastrointestinal: Negative for abdominal pain, constipation, diarrhea, nausea and vomiting.  Genitourinary: Positive for flank pain. Negative for dysuria, hematuria and vaginal bleeding.  Musculoskeletal: Positive for back pain and myalgias.  Neurological: Negative for weakness and numbness.  All other systems reviewed and are negative.    Physical Exam Updated Vital Signs BP (!) 152/83 (BP Location: Right Arm)   Pulse 90   Temp 98.2 F (36.8 C) (Oral)   Resp 16   Ht 5'  6" (1.676 m)   Wt 115.7 kg   SpO2 98%   BMI 41.16 kg/m   Physical Exam  Constitutional: She appears well-developed and well-nourished. No distress.  Morbidly obese  HENT:  Head: Normocephalic and atraumatic.  Right Ear: External ear normal.  Left Ear: External ear normal.  Nose: Nose normal.  Eyes: Right eye exhibits no discharge. Left eye exhibits no discharge.  Cardiovascular: Normal rate, regular rhythm and normal heart sounds.  Pulses:      Dorsalis pedis pulses are 2+ on the right side, and 2+ on the left side.  Pulmonary/Chest: Effort normal and breath sounds normal.  Abdominal: Soft. There is no tenderness. There is CVA tenderness (right).  Musculoskeletal:  No significant tenderness or appreciable swelling to the bilateral legs including thighs and calves.  No obvious skin changes. 5/5 strength in BLE, normal gross sensation  Neurological: She is alert.  Skin: Skin is warm and dry. She is not diaphoretic.  Psychiatric: Her mood appears not anxious.  Nursing note and vitals reviewed.    ED Treatments / Results  Labs (all labs ordered are listed, but only abnormal results are displayed) Labs Reviewed  URINALYSIS, ROUTINE W REFLEX MICROSCOPIC - Abnormal; Notable for the following components:      Result Value   Leukocytes, UA TRACE (*)    All other components within normal limits  COMPREHENSIVE METABOLIC PANEL - Abnormal; Notable for the following components:   Glucose, Bld 110 (*)    Creatinine, Ser 1.12 (*)    Total Bilirubin 0.2 (*)    GFR calc non Af Amer 57 (*)    All other components within normal limits  CBC WITH DIFFERENTIAL/PLATELET - Abnormal; Notable for the following components:   WBC 12.4 (*)    RBC 5.52 (*)    Hemoglobin 15.2 (*)    HCT 46.3 (*)    RDW 15.7 (*)    Neutro Abs 8.8 (*)    All other components within normal limits  PREGNANCY, URINE  LIPASE, BLOOD  CK  D-DIMER, QUANTITATIVE (NOT AT [facility])    EKG None  Radiology Ct Renal Stone  Study  Result Date: 10/29/2017 CLINICAL DATA:  Right flank pain radiating to back EXAM: CT ABDOMEN AND PELVIS WITHOUT CONTRAST TECHNIQUE: Multidetector CT imaging of the abdomen and pelvis was performed following the standard protocol without IV contrast. COMPARISON:  10/22/2015 FINDINGS: Lower chest: Lung bases are clear. Hepatobiliary: Unenhanced liver is unremarkable. Gallbladder is unremarkable. No intrahepatic or extrahepatic ductal dilatation.  Pancreas: Within normal limits. Spleen: Within normal limits. Adrenals/Urinary Tract: Adrenal glands within normal limits. Kidneys are within normal limits. No renal, ureteral, or bladder calculi. No hydronephrosis. Bladder is within normal limits. Stomach/Bowel: Stomach is within normal limits. No evidence of bowel obstruction. Normal appendix (series 5/image 51). Vascular/Lymphatic: No evidence of abdominal aortic aneurysm. No suspicious abdominopelvic lymphadenopathy. Reproductive: Uterus is grossly unremarkable. Left ovary is within normal limits. 3.2 cm complex/hyperdense right ovarian lesion versus pedunculated uterine fibroid (series 2/image 32), previously 2.9 cm. Additional dilated tubal cystic lesion in the right adnexa (series 2/image 66), unchanged. Other: No abdominopelvic ascites. Musculoskeletal: Mild degenerative changes of the lower thoracic spine. IMPRESSION: No renal, ureteral, or bladder calculi.  No hydronephrosis. No evidence of bowel obstruction.  Normal appendix. 3.2 cm complex right ovarian lesion versus pedunculated right adnexal fibroid. Consider pelvic ultrasound for further evaluation, as clinically warranted. Suspected chronic right hydrosalpinx. Electronically Signed   By: Julian Hy M.D.   On: 10/29/2017 19:06    Procedures Procedures (including critical care time)  Medications Ordered in ED Medications - No data to display   Initial Impression / Assessment and Plan / ED Course  I have reviewed the triage vital signs  and the nursing notes.  Pertinent labs & imaging results that were available during my care of the patient were reviewed by me and considered in my medical decision making (see chart for details).     Patient's pain has resolved on its own.  Probably given the point tenderness this was muscular.  However given age and sudden onset, CT obtained.  Aorta is normal caliber.  No obvious ureteral stone.  She is concerned about blood clot although on exam there is no obvious DVT in her legs.  Unclear why she is feeling the pain and subjective weakness in her legs.  CK is benign.  D-dimer is negative.  Given low suspicion for PE/DVT I think this is enough to rule out PE and no further treatment or work-up is needed.  Given her symptoms allow resolved, I think she stable for discharge home to follow-up with PCP.  We discussed the ovarian abnormality seen on CT given no acute pain right now I do not think she needs the emergent ultrasound but can follow-up with her PCP.  Final Clinical Impressions(s) / ED Diagnoses   Final diagnoses:  Right flank pain    ED Discharge Orders    None       Sherwood Gambler, MD 10/29/17 2055

## 2017-11-10 DIAGNOSIS — Z1389 Encounter for screening for other disorder: Secondary | ICD-10-CM | POA: Diagnosis not present

## 2017-11-10 DIAGNOSIS — N83291 Other ovarian cyst, right side: Secondary | ICD-10-CM | POA: Diagnosis not present

## 2017-11-10 DIAGNOSIS — Z6841 Body Mass Index (BMI) 40.0 and over, adult: Secondary | ICD-10-CM | POA: Diagnosis not present

## 2017-11-14 ENCOUNTER — Other Ambulatory Visit: Payer: Self-pay | Admitting: Family Medicine

## 2017-11-14 DIAGNOSIS — N83291 Other ovarian cyst, right side: Secondary | ICD-10-CM

## 2017-11-24 ENCOUNTER — Ambulatory Visit (HOSPITAL_COMMUNITY)
Admission: RE | Admit: 2017-11-24 | Discharge: 2017-11-24 | Disposition: A | Discharge status: Home / Self Care | Payer: BLUE CROSS/BLUE SHIELD | Source: Ambulatory Visit | Attending: Family Medicine | Admitting: Family Medicine

## 2017-11-24 DIAGNOSIS — N83291 Other ovarian cyst, right side: Secondary | ICD-10-CM

## 2017-11-24 DIAGNOSIS — N858 Other specified noninflammatory disorders of uterus: Secondary | ICD-10-CM | POA: Insufficient documentation

## 2017-11-24 DIAGNOSIS — N7011 Chronic salpingitis: Secondary | ICD-10-CM | POA: Insufficient documentation

## 2018-04-12 DIAGNOSIS — F419 Anxiety disorder, unspecified: Secondary | ICD-10-CM | POA: Diagnosis not present

## 2018-04-12 DIAGNOSIS — E6609 Other obesity due to excess calories: Secondary | ICD-10-CM | POA: Diagnosis not present

## 2018-04-12 DIAGNOSIS — Z6836 Body mass index (BMI) 36.0-36.9, adult: Secondary | ICD-10-CM | POA: Diagnosis not present

## 2018-04-12 DIAGNOSIS — J4521 Mild intermittent asthma with (acute) exacerbation: Secondary | ICD-10-CM | POA: Diagnosis not present

## 2018-04-12 DIAGNOSIS — Z1389 Encounter for screening for other disorder: Secondary | ICD-10-CM | POA: Diagnosis not present

## 2018-04-12 DIAGNOSIS — I10 Essential (primary) hypertension: Secondary | ICD-10-CM | POA: Diagnosis not present

## 2018-05-10 DIAGNOSIS — J4520 Mild intermittent asthma, uncomplicated: Secondary | ICD-10-CM | POA: Diagnosis not present

## 2018-05-19 ENCOUNTER — Emergency Department (HOSPITAL_COMMUNITY)
Admission: EM | Admit: 2018-05-19 | Discharge: 2018-05-19 | Disposition: A | Discharge status: Home / Self Care | Payer: BLUE CROSS/BLUE SHIELD | Attending: Emergency Medicine | Admitting: Emergency Medicine

## 2018-05-19 ENCOUNTER — Encounter (HOSPITAL_COMMUNITY): Payer: Self-pay | Admitting: Emergency Medicine

## 2018-05-19 ENCOUNTER — Emergency Department (HOSPITAL_COMMUNITY): Payer: BLUE CROSS/BLUE SHIELD

## 2018-05-19 ENCOUNTER — Other Ambulatory Visit: Payer: Self-pay

## 2018-05-19 DIAGNOSIS — J45909 Unspecified asthma, uncomplicated: Secondary | ICD-10-CM | POA: Insufficient documentation

## 2018-05-19 DIAGNOSIS — N390 Urinary tract infection, site not specified: Secondary | ICD-10-CM | POA: Insufficient documentation

## 2018-05-19 DIAGNOSIS — Z79899 Other long term (current) drug therapy: Secondary | ICD-10-CM | POA: Diagnosis not present

## 2018-05-19 DIAGNOSIS — Z7982 Long term (current) use of aspirin: Secondary | ICD-10-CM | POA: Diagnosis not present

## 2018-05-19 DIAGNOSIS — I10 Essential (primary) hypertension: Secondary | ICD-10-CM | POA: Insufficient documentation

## 2018-05-19 DIAGNOSIS — M545 Low back pain: Secondary | ICD-10-CM | POA: Diagnosis not present

## 2018-05-19 DIAGNOSIS — R109 Unspecified abdominal pain: Secondary | ICD-10-CM | POA: Diagnosis not present

## 2018-05-19 LAB — URINALYSIS, ROUTINE W REFLEX MICROSCOPIC
Bacteria, UA: NONE SEEN
Bilirubin Urine: NEGATIVE
Glucose, UA: NEGATIVE mg/dL
Ketones, ur: NEGATIVE mg/dL
Nitrite: NEGATIVE
Protein, ur: NEGATIVE mg/dL
RBC / HPF: >50 RBC/hpf — ABNORMAL HIGH (ref 0–5)
Specific Gravity, Urine: 1.021 (ref 1.005–1.030)
pH: 6 (ref 5.0–8.0)

## 2018-05-19 LAB — POC URINE PREG, ED: Preg Test, Ur: NEGATIVE

## 2018-05-19 MED ORDER — CEPHALEXIN 500 MG PO CAPS
500.0000 mg | ORAL_CAPSULE | Freq: Once | ORAL | Status: AC
  Administered 2018-05-19: 500 mg via ORAL
  Filled 2018-05-19: qty 1

## 2018-05-19 MED ORDER — CEPHALEXIN 500 MG PO CAPS: 500.0000 mg | ORAL_CAPSULE | Freq: Four times a day (QID) | ORAL | 0 refills | Status: DC

## 2018-05-19 NOTE — Discharge Instructions (Addendum)
You definitely have a urinary tract infection.  Prescription for antibiotic.  Increase fluids.  The pain radiating down your left leg may be related to your lower back.  Follow-up with your primary care doctor.

## 2018-05-19 NOTE — ED Notes (Signed)
ED Provider at bedside. 

## 2018-05-19 NOTE — ED Provider Notes (Addendum)
[facility] EMERGENCY DEPARTMENT Provider Note   CSN: [id number] Arrival date & time: 05/19/18  1803    History   Chief Complaint Chief Complaint  Patient presents with  . Flank Pain    HPI Vanessa George is a 48 y.o. female.     Left lower quadrant/lateral abdominal pain since this morning with a radicular component to the left lateral thigh.  No posterior thigh or posterior calf pain.  No prolonged travel or immobilization.  Review of systems positive for urinary frequency, but no dysuria or hematuria.  No fever, chills, sob, cp.  Severity of symptoms is mild.  Nothing makes symptoms better or worse.     Past Medical History:  Diagnosis Date  . Acid reflux   . Anemia   . Asthma   . Depression   . Essential hypertension     There are no active problems to display for this patient.   Past Surgical History:  Procedure Laterality Date  . TUBAL LIGATION       OB History    Gravida      Para      Term      Preterm      AB      Living  0     SAB      TAB      Ectopic      Multiple      Live Births               Home Medications    Prior to Admission medications   Medication Sig Start Date End Date Taking? Authorizing Provider  ALPRAZolam Vanessa George) 0.5 MG tablet Take 1 tablet by mouth 3 (three) times daily as needed for anxiety.  06/16/17   [provider]  aspirin 81 MG chewable tablet Chew 81 mg by mouth daily.     [provider]  BREO ELLIPTA 100-25 MCG/INH AEPB Take 1 puff by mouth daily. 07/17/17   [provider]  cephALEXin (KEFLEX) 500 MG capsule Take 1 capsule (500 mg total) by mouth 4 (four) times daily. 05/19/18   Vanessa Christen, MD  citalopram (CELEXA) 10 MG tablet Take 10 mg by mouth daily as needed (anxiety).  07/13/17   [provider]  losartan (COZAAR) 25 MG tablet Take 25 mg by mouth daily. 10/05/17   [provider]  Multiple Vitamin (MULTIVITAMIN WITH MINERALS) TABS tablet Take 1 tablet by  mouth daily.    [provider]  pantoprazole (PROTONIX) 40 MG tablet Take 40 mg by mouth daily.  11/09/15   [provider]  VENTOLIN HFA 108 (90 BASE) MCG/ACT inhaler Inhale 2 puffs into the lungs every 4 (four) hours as needed for wheezing or shortness of breath.  02/27/13   [provider]  vitamin B-12 (CYANOCOBALAMIN) 50 MCG tablet Take 50 mcg by mouth daily.    [provider]    Family History Family History  Problem Relation Age of Onset  . AAA (abdominal aortic aneurysm) Mother   . Diabetes Mother   . Heart attack Father        Age 46    Social History Social History   Tobacco Use  . Smoking status: Never Smoker  . Smokeless tobacco: Never Used  Substance Use Topics  . Alcohol use: No  . Drug use: No     Allergies   Vicodin [hydrocodone-acetaminophen]   Review of Systems Review of Systems  All other systems reviewed and are negative.  Physical Exam Updated Vital Signs BP (!) 152/95 (BP Location: Right Arm)   Pulse (!) 103   Temp 98.7 F (37.1 C) (Temporal)   Resp 16   Ht 5\' 6"  (1.676 m)   Wt 117.9 kg   LMP 05/16/2018   SpO2 99%   BMI 41.97 kg/m   Physical Exam Vitals signs and nursing note reviewed.  Constitutional:      Appearance: She is well-developed.  HENT:     Head: Normocephalic and atraumatic.  Eyes:     Conjunctiva/sclera: Conjunctivae normal.  Neck:     Musculoskeletal: Neck supple.  Cardiovascular:     Rate and Rhythm: Normal rate and regular rhythm.  Pulmonary:     Effort: Pulmonary effort is normal.     Breath sounds: Normal breath sounds.  Abdominal:     General: Bowel sounds are normal.     Palpations: Abdomen is soft.     Comments: Minimally tender left lower lateral abdomen.  Musculoskeletal: Normal range of motion.  Skin:    General: Skin is warm and dry.     Comments: Spider vein left lateral thigh.  Neurological:     Mental Status: She is alert and oriented to person, place,  and time.  Psychiatric:        Behavior: Behavior normal.      ED Treatments / Results  Labs (all labs ordered are listed, but only abnormal results are displayed) Labs Reviewed  URINALYSIS, ROUTINE W REFLEX MICROSCOPIC - Abnormal; Notable for the following components:      Result Value   APPearance HAZY (*)    Hgb urine dipstick LARGE (*)    Leukocytes,Ua LARGE (*)    RBC / HPF >50 (*)    All other components within normal limits  URINE CULTURE  POC URINE PREG, ED    EKG None  Radiology Dg Lumbar Spine Complete  Result Date: 05/19/2018 CLINICAL DATA:  Left flank and back pain EXAM: LUMBAR SPINE - COMPLETE 4+ VIEW COMPARISON:  CT 10/29/2017 FINDINGS: Degenerative facet disease in the lower lumbar spine. Slight anterolisthesis of L4 on L5, 5 mm. Early anterior spurring at L4-5. No fracture. SI joints symmetric and unremarkable. IMPRESSION: Degenerative facet disease in the lower lumbar spine. No acute bony abnormality. Electronically Signed   By: Rolm Baptise M.D.   On: 05/19/2018 19:30    Procedures Procedures (including critical care time)  Medications Ordered in ED Medications  cephALEXin (KEFLEX) capsule 500 mg (has no administration in time range)     Initial Impression / Assessment and Plan / ED Course  I have reviewed the triage vital signs and the nursing notes.  Pertinent labs & imaging results that were available during my care of the patient were reviewed by me and considered in my medical decision making (see chart for details).        Patient presents with urinary frequency and left lower quadrant/left lower lateral abdominal pain.  Urinalysis is obviously infected.  She may have a radicular component to her thigh pain.  Plain films of the lumbar sacral spine show no acute findings.  No evidence of DVT.  Will Rx Keflex 500 mg.  Urine culture.  Follow-up with primary care.  Final Clinical Impressions(s) / ED Diagnoses   Final diagnoses:  Urinary tract  infection without hematuria, site unspecified    ED Discharge Orders         Ordered    cephALEXin (KEFLEX) 500 MG capsule  4 times daily  05/19/18 1951           Vanessa Christen, MD 05/19/18 Vanessa George    Vanessa Christen, MD 05/20/18 [phone]

## 2018-05-19 NOTE — ED Triage Notes (Signed)
Pt reports L flank pain and back pain with L lower abd pain  Since this am   Took Doanes pills without relief  Here for eval

## 2018-05-22 DIAGNOSIS — M5430 Sciatica, unspecified side: Secondary | ICD-10-CM | POA: Diagnosis not present

## 2018-05-22 DIAGNOSIS — I10 Essential (primary) hypertension: Secondary | ICD-10-CM | POA: Diagnosis not present

## 2018-05-22 LAB — URINE CULTURE: Culture: 20000 — AB

## 2018-05-23 ENCOUNTER — Telehealth: Payer: Self-pay

## 2018-05-23 NOTE — Telephone Encounter (Signed)
Post ED Visit - Positive Culture Follow-up  Culture report reviewed by antimicrobial stewardship pharmacist: [facility] Team []  Elenor Quinones, Pharm.D. []  Heide Guile, Pharm.D., BCPS AQ-ID []  Parks Neptune, Pharm.D., BCPS []  Alycia Rossetti, Pharm.D., BCPS []  [place], Pharm.D., BCPS, AAHIVP []  Legrand Como, Pharm.D., BCPS, AAHIVP []  Salome Arnt, PharmD, BCPS []  Johnnette Gourd, PharmD, BCPS []  Hughes Better, PharmD, BCPS []  Leeroy Cha, PharmD []  Laqueta Linden, PharmD, BCPS []  Albertina Parr, PharmD  [facility] []  Leodis Sias, PharmD []  Lindell Spar, PharmD []  Royetta Asal, PharmD []  Graylin Shiver, Rph []  Rema Fendt) Glennon Mac, PharmD []  Arlyn Dunning, PharmD []  Netta Cedars, PharmD []  Dia Sitter, PharmD []  Leone Haven, PharmD []  Gretta Arab, PharmD []  Theodis Shove, PharmD []  Peggyann Juba, PharmD []  Reuel Boom, PharmD   Positive urine culture Treated with Cephalexin, organism sensitive to the same and no further patient follow-up is required at this time.  Genia Del 05/23/2018, 10:00 AM

## 2018-05-24 DIAGNOSIS — I10 Essential (primary) hypertension: Secondary | ICD-10-CM | POA: Diagnosis not present

## 2018-09-21 ENCOUNTER — Other Ambulatory Visit (HOSPITAL_COMMUNITY): Payer: Self-pay | Admitting: Family Medicine

## 2018-09-21 DIAGNOSIS — Z1231 Encounter for screening mammogram for malignant neoplasm of breast: Secondary | ICD-10-CM

## 2018-10-22 ENCOUNTER — Ambulatory Visit (HOSPITAL_COMMUNITY): Payer: BLUE CROSS/BLUE SHIELD

## 2018-10-24 ENCOUNTER — Other Ambulatory Visit: Payer: Self-pay

## 2018-10-24 ENCOUNTER — Inpatient Hospital Stay (HOSPITAL_COMMUNITY): Admission: RE | Admit: 2018-10-24 | Payer: BLUE CROSS/BLUE SHIELD | Source: Ambulatory Visit

## 2018-10-24 ENCOUNTER — Ambulatory Visit (HOSPITAL_COMMUNITY)
Admission: RE | Admit: 2018-10-24 | Discharge: 2018-10-24 | Disposition: A | Discharge status: Home / Self Care | Payer: BC Managed Care – PPO | Source: Ambulatory Visit | Attending: Family Medicine | Admitting: Family Medicine

## 2018-10-24 DIAGNOSIS — Z1231 Encounter for screening mammogram for malignant neoplasm of breast: Secondary | ICD-10-CM | POA: Diagnosis not present

## 2018-11-28 ENCOUNTER — Emergency Department (HOSPITAL_COMMUNITY): Payer: BC Managed Care – PPO

## 2018-11-28 ENCOUNTER — Encounter (HOSPITAL_COMMUNITY): Payer: Self-pay | Admitting: *Deleted

## 2018-11-28 ENCOUNTER — Other Ambulatory Visit: Payer: Self-pay

## 2018-11-28 ENCOUNTER — Emergency Department (HOSPITAL_COMMUNITY)
Admission: EM | Admit: 2018-11-28 | Discharge: 2018-11-28 | Disposition: A | Discharge status: Home / Self Care | Payer: BC Managed Care – PPO | Attending: Emergency Medicine | Admitting: Emergency Medicine

## 2018-11-28 DIAGNOSIS — H00011 Hordeolum externum right upper eyelid: Secondary | ICD-10-CM | POA: Diagnosis not present

## 2018-11-28 DIAGNOSIS — J45909 Unspecified asthma, uncomplicated: Secondary | ICD-10-CM | POA: Diagnosis not present

## 2018-11-28 DIAGNOSIS — R079 Chest pain, unspecified: Secondary | ICD-10-CM | POA: Diagnosis not present

## 2018-11-28 DIAGNOSIS — I10 Essential (primary) hypertension: Secondary | ICD-10-CM | POA: Diagnosis not present

## 2018-11-28 DIAGNOSIS — Z7982 Long term (current) use of aspirin: Secondary | ICD-10-CM | POA: Insufficient documentation

## 2018-11-28 DIAGNOSIS — Z79899 Other long term (current) drug therapy: Secondary | ICD-10-CM | POA: Diagnosis not present

## 2018-11-28 DIAGNOSIS — R0602 Shortness of breath: Secondary | ICD-10-CM | POA: Diagnosis not present

## 2018-11-28 DIAGNOSIS — R0789 Other chest pain: Secondary | ICD-10-CM | POA: Diagnosis not present

## 2018-11-28 LAB — TROPONIN I (HIGH SENSITIVITY)
Troponin I (High Sensitivity): <2 ng/L (ref <18)
Troponin I (High Sensitivity): <2 ng/L (ref <18)

## 2018-11-28 LAB — CBC
HCT: 48.2 % — ABNORMAL HIGH (ref 36.0–46.0)
Hemoglobin: 14.5 g/dL (ref 12.0–15.0)
MCH: 26.1 pg (ref 26.0–34.0)
MCHC: 30.1 g/dL (ref 30.0–36.0)
MCV: 86.8 fL (ref 80.0–100.0)
Platelets: 221 10*3/uL (ref 150–400)
RBC: 5.55 MIL/uL — ABNORMAL HIGH (ref 3.87–5.11)
RDW: 15.5 % (ref 11.5–15.5)
WBC: 9.7 10*3/uL (ref 4.0–10.5)
nRBC: 0 % (ref 0.0–0.2)

## 2018-11-28 LAB — BASIC METABOLIC PANEL
Anion gap: 9 (ref 5–15)
BUN: 9 mg/dL (ref 6–20)
CO2: 25 mmol/L (ref 22–32)
Calcium: 9.3 mg/dL (ref 8.9–10.3)
Chloride: 103 mmol/L (ref 98–111)
Creatinine, Ser: 0.97 mg/dL (ref 0.44–1.00)
GFR calc Af Amer: >60 mL/min (ref >60)
GFR calc non Af Amer: >60 mL/min (ref >60)
Glucose, Bld: 139 mg/dL — ABNORMAL HIGH (ref 70–99)
Potassium: 4.1 mmol/L (ref 3.5–5.1)
Sodium: 137 mmol/L (ref 135–145)

## 2018-11-28 MED ORDER — SODIUM CHLORIDE 0.9% FLUSH: 3.0000 mL | Freq: Once | INTRAVENOUS | Status: DC

## 2018-11-28 NOTE — Discharge Instructions (Addendum)
Please follow-up with your PCP this week to discuss your anxiety medication, reflux medication, and your elevated BP here in the ED.  Hopefully your hordeolum (stye) has begun to improve with the administration of warm compresses and eyelid massage, if not he may require antibiotic therapy.  Please return to the ED or seek medical attention should you develop any fever chills, eye pain, worsening swelling or erythema, pain with extraocular movements, worsening chest pain or shortness of breath, or any other new or worsening symptoms.

## 2018-11-28 NOTE — ED Provider Notes (Signed)
[facility] EMERGENCY DEPARTMENT Provider Note   CSN: [id number] Arrival date & time: 11/28/18  1314     History   Chief Complaint No chief complaint on file.   HPI Vanessa George is a 48 y.o. female PMH significant for HTN, anxiety, asthma, and GERD who presents to the ED with a 1 day complaint of 2-10 constant, dull, nonradiating chest pain.  She reports that around 7:30 AM this morning she developed pain between the back of her shoulder blades which then radiated to her left arm.  It then proceeded to settle in the middle of her chest and was described as a dull, nonradiating pain.  She took a baby aspirin which alleviated her discomfort.  5 hours later, she reports that her chest pain returned which prompted her to come to the ED for evaluation.  She had taken another 81 mg aspirin which again resolved her discomfort.  These episodes were not associated with any exertion or other obvious patterns or triggers.  She denies any diaphoresis, dizziness, headache, nausea or vomiting, abdominal discomfort, blurred vision or other neurologic deficit, or shortness of breath symptoms.  Patient denies any chance of pregnancy. 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  She also reportedly has not been taking her omeprazole, as she read about its harmful effects online.  Lastly, she noticed swelling over her right eyelid this morning and is concerned for stye.  She has been using warm compresses, with some effect.     HPI  Past Medical History:  Diagnosis Date  . Acid reflux   . Anemia   . Asthma   . Depression   . Essential hypertension     There are no active problems to display for this patient.   Past Surgical History:  Procedure Laterality Date  . TUBAL LIGATION       OB History    Gravida      Para      Term      Preterm      AB      Living  0     SAB      TAB      Ectopic      Multiple      Live Births               Home Medications    Prior to Admission medications   Medication Sig Start Date End Date Taking? Authorizing Provider  ALPRAZolam Duanne Moron) 0.5 MG tablet Take 1 tablet by mouth 3 (three) times daily as needed for anxiety.  06/16/17   [provider]  aspirin 81 MG chewable tablet Chew 81 mg by mouth daily.     [provider]  BREO ELLIPTA 100-25 MCG/INH AEPB Take 1 puff by mouth daily. 07/17/17   [provider]  cephALEXin (KEFLEX) 500 MG capsule Take 1 capsule (500 mg total) by mouth 4 (four) times daily. 05/19/18   Nat Christen, MD  citalopram (CELEXA) 10 MG tablet Take 10 mg by mouth daily as needed (anxiety).  07/13/17   [provider]  losartan (COZAAR) 25 MG tablet Take 25 mg by mouth daily. 10/05/17   [provider]  Multiple Vitamin (MULTIVITAMIN WITH MINERALS) TABS tablet Take 1 tablet by mouth daily.    [provider]  pantoprazole (PROTONIX) 40 MG tablet Take 40 mg by mouth daily.  11/09/15   [provider]  VENTOLIN HFA 108 (90 BASE) MCG/ACT inhaler Inhale 2 puffs into the lungs every 4 (four) hours as needed for wheezing or shortness of breath.  02/27/13   [provider]  vitamin B-12 (CYANOCOBALAMIN) 50 MCG tablet Take 50 mcg by mouth daily.    [provider]    Family History Family History  Problem Relation Age of Onset  . AAA (abdominal aortic aneurysm) Mother   . Diabetes Mother   . Heart attack Father        Age 26    Social History Social History   Tobacco Use  . Smoking status: Never Smoker  . Smokeless tobacco: Never Used  Substance Use Topics  . Alcohol use: No  . Drug use: No     Allergies   Vicodin [hydrocodone-acetaminophen]   Review of Systems Review of Systems  All other systems reviewed and are negative.    Physical Exam Updated Vital Signs BP (!) 167/102 (BP Location: Left Arm)   Pulse 94   Temp 97.6 F (36.4  C) (Temporal)   Resp 16   Ht 5\' 5"  (1.651 m)   Wt 121.1 kg   LMP 11/14/2018   SpO2 100%   BMI 44.43 kg/m   Physical Exam Vitals signs and nursing note reviewed. Exam conducted with a chaperone present.  Constitutional:      Appearance: Normal appearance.  HENT:     Head: Normocephalic and atraumatic.  Eyes:     General: No scleral icterus.    Comments: Eyes: EOMs intact.  Visual acuity intact.  No pain with EOMs.  Conjunctive are normal. Right upper eyelid: Swelling and erythema nasal aspect.  Tenderness to palpation.  No purulence or discharge noted.   Neck:     Musculoskeletal: Normal range of motion. No neck rigidity.  Cardiovascular:     Rate and Rhythm: Normal rate and regular rhythm.     Pulses: Normal pulses.     Heart sounds: Normal heart sounds.  Pulmonary:     Effort: Pulmonary effort is normal. No respiratory distress.     Breath sounds: Normal breath sounds. No wheezing.  Skin:    General: Skin is dry.  Neurological:     Mental Status: She is alert and oriented to person, place, and time.     GCS: GCS eye subscore is 4. GCS verbal subscore is 5. GCS motor subscore is 6.     Cranial Nerves: No cranial nerve deficit.     Sensory: No sensory deficit.     Gait: Gait normal.  Psychiatric:        Mood and Affect: Mood normal.        Behavior: Behavior normal.        Thought Content: Thought content normal.      ED Treatments / Results  Labs (all labs ordered are listed, but only abnormal results are displayed) Labs Reviewed  BASIC METABOLIC PANEL - Abnormal; Notable for the following components:      Result Value   Glucose, Bld 139 (*)    All other components within normal limits  CBC - Abnormal; Notable for the following components:   RBC 5.55 (*)    HCT 48.2 (*)    All other components within normal limits  TROPONIN I (HIGH SENSITIVITY)  TROPONIN I (HIGH SENSITIVITY)    EKG EKG Interpretation  Date/Time:  Wednesday November 28 2018 13:21:38 EST  Ventricular Rate:  95 PR Interval:  152 QRS Duration: 78 QT Interval:  344 QTC Calculation: 432 R Axis:   76 Text Interpretation: Sinus rhythm with Premature supraventricular complexes and with occasional Premature ventricular complexes Nonspecific T wave abnormality Confirmed by Lajean Saver [phone]) on 11/28/2018 6:14:06 PM   Radiology Dg Chest 2 View  Result Date: 11/28/2018 CLINICAL DATA:  Chest pain and shortness of breath EXAM: CHEST - 2 VIEW COMPARISON:  August 10, 2017 FINDINGS: The lungs are clear. The heart size and pulmonary vascularity are normal. No adenopathy. No pneumothorax. No bone lesions. IMPRESSION: No edema or consolidation. Electronically Signed   By: Lowella Grip III M.D.   On: 11/28/2018 13:45    Procedures Procedures (including critical care time)  Medications Ordered in ED Medications  sodium chloride flush (NS) 0.9 % injection 3 mL (has no administration in time range)     Initial Impression / Assessment and Plan / ED Course  I have reviewed the triage vital signs and the nursing notes.  Pertinent labs & imaging results that were available during my care of the patient were reviewed by me and considered in my medical decision making (see chart for details).        Patient denies any history of exertional chest pain, shortness of breath, or syncope.  Her episodes of chest discomfort today were fleeting and only lasted approximately 5 to 10 minutes each.  Her troponin was less than 2 despite initial episode of chest discomfort experienced 7 hours prior to blood draw.  Her EKG was also reviewed and is interpreted as normal sinus rhythm with occasional PVCs.  She denies any palpitations or dizziness.  Reviewed echocardiogram from 2018 which revealed a 65 to 70% left ventricular ejection fraction.  She is no longer complaining of any chest discomfort and she has no shortness of breath symptoms.  Low suspicion for ACS or dissection.  Patient does not smoke  tobacco, use well contraceptives, have history of clots, any known clotting disorder, recent immobilization or surgery, or recent leg swelling or discomfort.  Patient is PERC negative and do not feel as though CTA is warranted at this time.  She denies any pleuritic chest pain and she is neither tachycardic, tachypneic, or hypoxic.  Her DG chest was reviewed and no evidence of pneumothorax.  She had equal breath sounds bilaterally and she denies any pleuritic chest pain.  Patient was accompanied by her husband who notes that she has been increasingly anxious recently and patient confirmed.  She has a lot of stress between work and the election.  Additionally, she has discontinued taking her 40 mg p.o. daily pantoprazole as prescribed for her history of GERD due to concerns over adverse effect that she read online.  She admits that it could be either her anxiety or reflux contributing to her discomfort today.  Given that she is no longer actively having any chest pain, we will not provide GI cocktail here in the ED.  Encouraged her to see her PCP ASAP to determine whether or not alternative management for her reflux is worthwhile as well as to discuss her recently increased anxiety and stress.  Her BP is elevated here in the ED, but no evidence of end-organ damage.  Also encouraged her to follow-up with her PCP about her blood pressure.   She also clinically appears to have a hordeolum on her right upper eyelid.  It is warm, tender, and erythematous.  Her eye itself is not painful and she reports no discomfort with EOMs.  Visual acuity is intact.  Atraumatic.  Do not suspect a preseptal or post septal cellulitis at this time.  Informed patient that styes will likely spontaneously resolving 5 to 7 days and to continue using warm compresses and eyelid massage.  Please return to the ED or seek medical attention should you develop any fever chills, eye pain, worsening swelling or erythema, pain with extraocular  movements, worsening chest pain or shortness of breath, or any other new or worsening symptoms.   Final Clinical Impressions(s) / ED Diagnoses   Final diagnoses:  Hordeolum externum of right upper eyelid  Chest pain, unspecified type    ED Discharge Orders    None       Corena Herter, PA-C 11/28/18 1918    Lajean Saver, MD 11/28/18 2144

## 2018-11-28 NOTE — ED Triage Notes (Signed)
Pt in c/o L sided CP with SOB onset today @ 8am, pt took x 1 81 mg ASA, pt denies n/v/d, A&O x4, pt denies cardiac hx

## 2018-11-30 DIAGNOSIS — F419 Anxiety disorder, unspecified: Secondary | ICD-10-CM | POA: Diagnosis not present

## 2018-11-30 DIAGNOSIS — E7849 Other hyperlipidemia: Secondary | ICD-10-CM | POA: Diagnosis not present

## 2018-11-30 DIAGNOSIS — I10 Essential (primary) hypertension: Secondary | ICD-10-CM | POA: Diagnosis not present

## 2018-12-28 DIAGNOSIS — Z0001 Encounter for general adult medical examination with abnormal findings: Secondary | ICD-10-CM | POA: Diagnosis not present

## 2018-12-28 DIAGNOSIS — K219 Gastro-esophageal reflux disease without esophagitis: Secondary | ICD-10-CM | POA: Diagnosis not present

## 2018-12-28 DIAGNOSIS — I10 Essential (primary) hypertension: Secondary | ICD-10-CM | POA: Diagnosis not present

## 2018-12-28 DIAGNOSIS — E7849 Other hyperlipidemia: Secondary | ICD-10-CM | POA: Diagnosis not present

## 2018-12-28 DIAGNOSIS — Z6841 Body Mass Index (BMI) 40.0 and over, adult: Secondary | ICD-10-CM | POA: Diagnosis not present

## 2018-12-28 DIAGNOSIS — J45909 Unspecified asthma, uncomplicated: Secondary | ICD-10-CM | POA: Diagnosis not present

## 2019-02-04 ENCOUNTER — Emergency Department (HOSPITAL_COMMUNITY)
Admission: EM | Admit: 2019-02-04 | Discharge: 2019-02-04 | Disposition: A | Discharge status: Home / Self Care | Payer: 59 | Attending: Emergency Medicine | Admitting: Emergency Medicine

## 2019-02-04 ENCOUNTER — Encounter (HOSPITAL_COMMUNITY): Payer: Self-pay | Admitting: Emergency Medicine

## 2019-02-04 ENCOUNTER — Other Ambulatory Visit: Payer: Self-pay

## 2019-02-04 DIAGNOSIS — N390 Urinary tract infection, site not specified: Secondary | ICD-10-CM

## 2019-02-04 DIAGNOSIS — Z79899 Other long term (current) drug therapy: Secondary | ICD-10-CM | POA: Insufficient documentation

## 2019-02-04 DIAGNOSIS — N3091 Cystitis, unspecified with hematuria: Secondary | ICD-10-CM | POA: Diagnosis not present

## 2019-02-04 DIAGNOSIS — Z7982 Long term (current) use of aspirin: Secondary | ICD-10-CM | POA: Diagnosis not present

## 2019-02-04 DIAGNOSIS — I10 Essential (primary) hypertension: Secondary | ICD-10-CM | POA: Diagnosis not present

## 2019-02-04 DIAGNOSIS — J45909 Unspecified asthma, uncomplicated: Secondary | ICD-10-CM | POA: Diagnosis not present

## 2019-02-04 DIAGNOSIS — M545 Low back pain: Secondary | ICD-10-CM | POA: Diagnosis present

## 2019-02-04 LAB — URINALYSIS, COMPLETE (UACMP) WITH MICROSCOPIC
Bilirubin Urine: NEGATIVE
Glucose, UA: NEGATIVE mg/dL
Ketones, ur: NEGATIVE mg/dL
Nitrite: NEGATIVE
Protein, ur: NEGATIVE mg/dL
RBC / HPF: >50 RBC/hpf — ABNORMAL HIGH (ref 0–5)
Specific Gravity, Urine: 1.009 (ref 1.005–1.030)
pH: 5 (ref 5.0–8.0)

## 2019-02-04 MED ORDER — CEPHALEXIN 500 MG PO CAPS: 500.0000 mg | ORAL_CAPSULE | Freq: Three times a day (TID) | ORAL | 0 refills | Status: DC

## 2019-02-04 MED ORDER — PHENAZOPYRIDINE HCL 200 MG PO TABS: 200.0000 mg | ORAL_TABLET | Freq: Three times a day (TID) | ORAL | 0 refills | Status: DC

## 2019-02-04 MED ORDER — CEPHALEXIN 500 MG PO CAPS
500.0000 mg | ORAL_CAPSULE | Freq: Once | ORAL | Status: AC
  Administered 2019-02-04: 04:00:00 500 mg via ORAL
  Filled 2019-02-04: qty 1

## 2019-02-04 MED ORDER — PHENAZOPYRIDINE HCL 100 MG PO TABS
200.0000 mg | ORAL_TABLET | Freq: Once | ORAL | Status: AC
  Administered 2019-02-04: 04:00:00 200 mg via ORAL
  Filled 2019-02-04: qty 2

## 2019-02-04 NOTE — Discharge Instructions (Addendum)
Drink plenty of fluids.  Take the antibiotics until gone.  Take the Pyridium for pain on urination which would be about 2 days.  Please wear old underwear or pantiliners because it will stain your underwear if you have leakage.  Recheck if you get fever, vomiting, or worsening pain in your side.

## 2019-02-04 NOTE — ED Provider Notes (Signed)
[facility] EMERGENCY DEPARTMENT Provider Note   CSN: [id number] Arrival date & time: 02/04/19  0157     History Chief Complaint  Patient presents with  . Back Pain    Vanessa George is a 49 y.o. female.  HPI   Patient states since January 7 she has had frequency, urgency, and dysuria.  Earlier today she had some pain in her left flank.  She states a few days ago she had some pain on her right lower abdomen.  However all these pains are gone at this time.  She denies fever, nausea, or vomiting.  She states about a year ago she had a urinary tract infection with some flank pain and had a CT done at that time which did not show kidney stones.  She also states she has had a E. coli infection before.  PCP Sharilyn Sites, MD   Past Medical History:  Diagnosis Date  . Acid reflux   . Anemia   . Asthma   . Depression   . Essential hypertension     There are no problems to display for this patient.   Past Surgical History:  Procedure Laterality Date  . TUBAL LIGATION       OB History    Gravida      Para      Term      Preterm      AB      Living  0     SAB      TAB      Ectopic      Multiple      Live Births              Family History  Problem Relation Age of Onset  . AAA (abdominal aortic aneurysm) Mother   . Diabetes Mother   . Heart attack Father        Age 58    Social History   Tobacco Use  . Smoking status: Never Smoker  . Smokeless tobacco: Never Used  Substance Use Topics  . Alcohol use: No  . Drug use: No  lives with spouse  Home Medications Prior to Admission medications   Medication Sig Start Date End Date Taking? Authorizing Provider  ALPRAZolam Duanne Moron) 0.5 MG tablet Take 1 tablet by mouth 3 (three) times daily as needed for anxiety.  06/16/17   [provider]  aspirin 81 MG chewable tablet Chew 81 mg by mouth daily.     [provider]  BREO ELLIPTA 100-25 MCG/INH AEPB Take 1 puff by mouth daily. 07/17/17    [provider]  cephALEXin (KEFLEX) 500 MG capsule Take 1 capsule (500 mg total) by mouth 3 (three) times daily. 02/04/19   Rolland Porter, MD  citalopram (CELEXA) 10 MG tablet Take 10 mg by mouth daily as needed (anxiety).  07/13/17   [provider]  losartan (COZAAR) 25 MG tablet Take 25 mg by mouth daily. 10/05/17   [provider]  Multiple Vitamin (MULTIVITAMIN WITH MINERALS) TABS tablet Take 1 tablet by mouth daily.    [provider]  pantoprazole (PROTONIX) 40 MG tablet Take 40 mg by mouth daily.  11/09/15   [provider]  phenazopyridine (PYRIDIUM) 200 MG tablet Take 1 tablet (200 mg total) by mouth 3 (three) times daily. 02/04/19   Rolland Porter, MD  VENTOLIN HFA 108 (90 BASE) MCG/ACT inhaler Inhale 2 puffs into the lungs every 4 (four) hours as needed for wheezing or shortness of  breath.  02/27/13   [provider]  vitamin B-12 (CYANOCOBALAMIN) 50 MCG tablet Take 50 mcg by mouth daily.    [provider]    Allergies    Vicodin [hydrocodone-acetaminophen]  Review of Systems   Review of Systems  All other systems reviewed and are negative.   Physical Exam Updated Vital Signs BP (!) 166/89 (BP Location: Left Arm)   Pulse 86   Temp 98.2 F (36.8 C) (Oral)   Resp 18   Ht 5\' 6"  (1.676 m)   Wt 121.1 kg   SpO2 99%   BMI 43.09 kg/m   Physical Exam Vitals and nursing note reviewed.  Constitutional:      General: She is not in acute distress.    Appearance: Normal appearance. She is well-developed. She is obese. She is not ill-appearing or toxic-appearing.  HENT:     Head: Normocephalic and atraumatic.     Right Ear: External ear normal.     Left Ear: External ear normal.     Nose: Nose normal. No mucosal edema or rhinorrhea.     Mouth/Throat:     Dentition: No dental abscesses.     Pharynx: No uvula swelling.  Eyes:     Extraocular Movements: Extraocular movements intact.     Conjunctiva/sclera: Conjunctivae  normal.     Pupils: Pupils are equal, round, and reactive to light.  Cardiovascular:     Rate and Rhythm: Normal rate and regular rhythm.     Heart sounds: Normal heart sounds. No murmur. No friction rub. No gallop.   Pulmonary:     Effort: Pulmonary effort is normal. No respiratory distress.     Breath sounds: Normal breath sounds. No wheezing, rhonchi or rales.  Chest:     Chest wall: No tenderness or crepitus.  Abdominal:     General: Bowel sounds are normal. There is no distension.     Palpations: Abdomen is soft.     Tenderness: There is no abdominal tenderness. There is no right CVA tenderness, left CVA tenderness, guarding or rebound.  Musculoskeletal:        General: No tenderness. Normal range of motion.     Cervical back: Full passive range of motion without pain, normal range of motion and neck supple.     Comments: Moves all extremities well.   Skin:    General: Skin is warm and dry.     Coloration: Skin is not pale.     Findings: No erythema or rash.  Neurological:     General: No focal deficit present.     Mental Status: She is alert and oriented to person, place, and time.     Cranial Nerves: No cranial nerve deficit.  Psychiatric:        Mood and Affect: Mood normal. Mood is not anxious.        Speech: Speech normal.        Behavior: Behavior normal.        Thought Content: Thought content normal.     ED Results / Procedures / Treatments   Labs (all labs ordered are listed, but only abnormal results are displayed) Results for orders placed or performed during the hospital encounter of 02/04/19  Urinalysis, Complete w Microscopic  Result Value Ref Range   Color, Urine YELLOW YELLOW   APPearance CLEAR CLEAR   Specific Gravity, Urine 1.009 1.005 - 1.030   pH 5.0 5.0 - 8.0   Glucose, UA NEGATIVE NEGATIVE mg/dL   Hgb urine  dipstick LARGE (A) NEGATIVE   Bilirubin Urine NEGATIVE NEGATIVE   Ketones, ur NEGATIVE NEGATIVE mg/dL   Protein, ur NEGATIVE NEGATIVE  mg/dL   Nitrite NEGATIVE NEGATIVE   Leukocytes,Ua SMALL (A) NEGATIVE   RBC / HPF >50 (H) 0 - 5 RBC/hpf   WBC, UA 21-50 0 - 5 WBC/hpf   Bacteria, UA RARE (A) NONE SEEN   Squamous Epithelial / LPF 0-5 0 - 5   Mucus PRESENT    Laboratory interpretation all normal except UTI    EKG None  Radiology No results found.  Procedures Procedures (including critical care time)   CT renal October 29, 2017 IMPRESSION: No renal, ureteral, or bladder calculi.  No hydronephrosis.  No evidence of bowel obstruction.  Normal appendix.  3.2 cm complex right ovarian lesion versus pedunculated right adnexal fibroid. Consider pelvic ultrasound for further evaluation, as clinically warranted.  Suspected chronic right hydrosalpinx.   Electronically Signed   By: Julian Hy M.D.   On: 10/29/2017 19:06  Urine culture May 19, 2018 Component 8 mo ago  Specimen Description URINE, CLEAN CATCH  Performed at [facility], [address] [place], [place] [address]   Special Requests NONE  Performed at [facility], [address], [place], [place] [address]   Culture 20,000 COLONIES/mL Lily    Report Status 05/22/2018 FINAL   Organism ID, Bacteria ESCHERICHIA COLIAbnormal    Resulting Agency [facility]  Susceptibility   Escherichia coli    MIC    AMPICILLIN 4 SENSITIVE  Sensitive    AMPICILLIN/SULBACTAM <=2 SENSITIVE  Sensitive    CEFAZOLIN <=4 SENSITIVE  Sensitive    CEFTRIAXONE <=1 SENSITIVE  Sensitive    CIPROFLOXACIN <=0.25 SENS... Sensitive    Extended ESBL NEGATIVE  Sensitive    GENTAMICIN <=1 SENSITIVE  Sensitive    IMIPENEM <=0.25 SENS... Sensitive    NITROFURANTOIN <=16 SENSIT... Sensitive    PIP/TAZO <=4 SENSITIVE  Sensitive    TRIMETH/SULFA <=20 SENSIT... Sensitive         Susceptibility Comments  Escherichia coli  20,000 COLONIES/mL ESCHERICHIA COLI      Specimen Collected: 05/19/18 18:40 Last Resulted: 05/22/18 07:45         Medications Ordered in ED Medications  cephALEXin (KEFLEX) capsule 500 mg (500 mg Oral Given 02/04/19 0334)  phenazopyridine (PYRIDIUM) tablet 200 mg (200 mg Oral Given 02/04/19 0334)    ED Course  I have reviewed the triage vital signs and the nursing notes.  Pertinent labs & imaging results that were available during my care of the patient were reviewed by me and considered in my medical decision making (see chart for details).    MDM Rules/Calculators/A&P                     Patient was started on Keflex and Pyridium for UTI.  She currently has no fever, vomiting, or flank pain to suggest she has pyelonephritis.  She did have a CT about a year ago that did not show kidney stones.  She can be rechecked if her symptoms get worse.  Final Clinical Impression(s) / ED Diagnoses Final diagnoses:  Urinary tract infection with hematuria, site unspecified    Rx / DC Orders ED Discharge Orders         Ordered    cephALEXin (KEFLEX) 500 MG capsule  3 times daily     02/04/19 0338    phenazopyridine (PYRIDIUM) 200 MG tablet  3 times daily  02/04/19 0338         Plan discharge  ,Rolland Porter, MD, Barbette Or, MD 02/04/19 [phone]

## 2019-02-04 NOTE — ED Triage Notes (Signed)
Pt states she is having L. Sided back pain with urinary urgency, frequency, and burning. Pt states she feels like it might be a UTI and that she has been trying to secure an appointment with her PCP.

## 2019-02-05 LAB — URINE CULTURE
Culture: NO GROWTH
Special Requests: NORMAL

## 2019-03-11 ENCOUNTER — Other Ambulatory Visit: Payer: Self-pay

## 2019-03-11 ENCOUNTER — Ambulatory Visit: Payer: 59 | Attending: Internal Medicine

## 2019-03-11 DIAGNOSIS — Z20822 Contact with and (suspected) exposure to covid-19: Secondary | ICD-10-CM

## 2019-03-12 LAB — NOVEL CORONAVIRUS, NAA: SARS-CoV-2, NAA: DETECTED — AB

## 2019-03-13 ENCOUNTER — Telehealth: Payer: Self-pay | Admitting: Nurse Practitioner

## 2019-03-13 ENCOUNTER — Telehealth: Payer: Self-pay | Admitting: Internal Medicine

## 2019-03-13 NOTE — Telephone Encounter (Signed)
Called to discuss with Orvilla Cornwall about Covid symptoms and the use of bamlanivimab, a monoclonal antibody infusion for those with mild to moderate Covid symptoms and at a high risk of hospitalization.     Pt is qualified for this infusion at the [facility] infusion center due to co-morbid conditions and/or a member of an at-risk group, however declines infusion at this time. Symptoms tier reviewed as well as criteria for ending isolation.  Symptoms reviewed that would warrant ED/Hospital evaluation. Preventative practices reviewed. Patient verbalized understanding. Patient advised to call back if he decides that he does want to get infusion. Callback number to the infusion center given. Patient advised to go to Urgent care or ED with severe symptoms. Last date she would be eligible for infusion is 03/16/19.  MyChart message also sent.    Alda Lea, AGPCNP-BC Pager: [phone] Amion: Bjorn Pippin

## 2019-03-13 NOTE — Telephone Encounter (Signed)
Called to discuss with patient about Covid symptoms and the use of bamlanivimab, a monoclonal antibody infusion for those with mild to moderate Covid symptoms and at high risk of hospitalization.   Pt appears to qualify for this infusion at [facility] infusion center due to co-morbid conditions and/or member of at risk group. Her BMI is 43. Will need to determine when symptoms started.   Unable to reach pt. Left message on mychart to reach out to clinic if interested or any questions.   Alan Ripper, NP-C [place]

## 2019-03-19 ENCOUNTER — Other Ambulatory Visit: Payer: Self-pay

## 2019-03-19 ENCOUNTER — Emergency Department (HOSPITAL_COMMUNITY): Payer: 59

## 2019-03-19 ENCOUNTER — Encounter (HOSPITAL_COMMUNITY): Payer: Self-pay | Admitting: Emergency Medicine

## 2019-03-19 ENCOUNTER — Emergency Department (HOSPITAL_COMMUNITY)
Admission: EM | Admit: 2019-03-19 | Discharge: 2019-03-19 | Disposition: A | Discharge status: Home / Self Care | Payer: 59 | Attending: Emergency Medicine | Admitting: Emergency Medicine

## 2019-03-19 DIAGNOSIS — U071 COVID-19: Secondary | ICD-10-CM | POA: Insufficient documentation

## 2019-03-19 DIAGNOSIS — J1282 Pneumonia due to coronavirus disease 2019: Secondary | ICD-10-CM

## 2019-03-19 DIAGNOSIS — R0602 Shortness of breath: Secondary | ICD-10-CM | POA: Diagnosis present

## 2019-03-19 DIAGNOSIS — J45909 Unspecified asthma, uncomplicated: Secondary | ICD-10-CM | POA: Diagnosis not present

## 2019-03-19 DIAGNOSIS — J189 Pneumonia, unspecified organism: Secondary | ICD-10-CM | POA: Diagnosis not present

## 2019-03-19 DIAGNOSIS — Z79899 Other long term (current) drug therapy: Secondary | ICD-10-CM | POA: Diagnosis not present

## 2019-03-19 DIAGNOSIS — Z7982 Long term (current) use of aspirin: Secondary | ICD-10-CM | POA: Insufficient documentation

## 2019-03-19 DIAGNOSIS — I10 Essential (primary) hypertension: Secondary | ICD-10-CM | POA: Diagnosis not present

## 2019-03-19 LAB — COMPREHENSIVE METABOLIC PANEL
ALT: 34 U/L (ref 0–44)
AST: 33 U/L (ref 15–41)
Albumin: 3.5 g/dL (ref 3.5–5.0)
Alkaline Phosphatase: 51 U/L (ref 38–126)
Anion gap: 11 (ref 5–15)
BUN: 14 mg/dL (ref 6–20)
CO2: 23 mmol/L (ref 22–32)
Calcium: 8.6 mg/dL — ABNORMAL LOW (ref 8.9–10.3)
Chloride: 101 mmol/L (ref 98–111)
Creatinine, Ser: 0.87 mg/dL (ref 0.44–1.00)
GFR calc Af Amer: >60 mL/min (ref >60)
GFR calc non Af Amer: >60 mL/min (ref >60)
Glucose, Bld: 127 mg/dL — ABNORMAL HIGH (ref 70–99)
Potassium: 4.1 mmol/L (ref 3.5–5.1)
Sodium: 135 mmol/L (ref 135–145)
Total Bilirubin: 0.5 mg/dL (ref 0.3–1.2)
Total Protein: 7.7 g/dL (ref 6.5–8.1)

## 2019-03-19 LAB — TRIGLYCERIDES: Triglycerides: 256 mg/dL — ABNORMAL HIGH (ref <150)

## 2019-03-19 LAB — CBC WITH DIFFERENTIAL/PLATELET
Abs Immature Granulocytes: 0.01 10*3/uL (ref 0.00–0.07)
Basophils Absolute: 0 10*3/uL (ref 0.0–0.1)
Basophils Relative: 0 %
Eosinophils Absolute: 0 10*3/uL (ref 0.0–0.5)
Eosinophils Relative: 0 %
HCT: 51.4 % — ABNORMAL HIGH (ref 36.0–46.0)
Hemoglobin: 15.9 g/dL — ABNORMAL HIGH (ref 12.0–15.0)
Immature Granulocytes: 0 %
Lymphocytes Relative: 17 %
Lymphs Abs: 1.2 10*3/uL (ref 0.7–4.0)
MCH: 25.7 pg — ABNORMAL LOW (ref 26.0–34.0)
MCHC: 30.9 g/dL (ref 30.0–36.0)
MCV: 83 fL (ref 80.0–100.0)
Monocytes Absolute: 0.3 10*3/uL (ref 0.1–1.0)
Monocytes Relative: 5 %
Neutro Abs: 5.5 10*3/uL (ref 1.7–7.7)
Neutrophils Relative %: 78 %
Platelets: 224 10*3/uL (ref 150–400)
RBC: 6.19 MIL/uL — ABNORMAL HIGH (ref 3.87–5.11)
RDW: 16.9 % — ABNORMAL HIGH (ref 11.5–15.5)
WBC: 7.1 10*3/uL (ref 4.0–10.5)
nRBC: 0 % (ref 0.0–0.2)

## 2019-03-19 LAB — BRAIN NATRIURETIC PEPTIDE: B Natriuretic Peptide: 78 pg/mL (ref 0.0–100.0)

## 2019-03-19 LAB — LACTIC ACID, PLASMA
Lactic Acid, Venous: 2 mmol/L (ref 0.5–1.9)
Lactic Acid, Venous: 1.4 mmol/L (ref 0.5–1.9)

## 2019-03-19 LAB — LACTATE DEHYDROGENASE: LDH: 317 U/L — ABNORMAL HIGH (ref 98–192)

## 2019-03-19 LAB — TROPONIN I (HIGH SENSITIVITY)
Troponin I (High Sensitivity): <2 ng/L (ref <18)
Troponin I (High Sensitivity): <2 ng/L (ref <18)

## 2019-03-19 LAB — D-DIMER, QUANTITATIVE: D-Dimer, Quant: 0.74 ug/mL-FEU — ABNORMAL HIGH (ref 0.00–0.50)

## 2019-03-19 LAB — FIBRINOGEN: Fibrinogen: 644 mg/dL — ABNORMAL HIGH (ref 210–475)

## 2019-03-19 LAB — FERRITIN: Ferritin: 319 ng/mL — ABNORMAL HIGH (ref 11–307)

## 2019-03-19 LAB — C-REACTIVE PROTEIN: CRP: 4.1 mg/dL — ABNORMAL HIGH (ref <1.0)

## 2019-03-19 LAB — PROCALCITONIN: Procalcitonin: <0.1 ng/mL

## 2019-03-19 MED ORDER — DOXYCYCLINE HYCLATE 100 MG PO TABS
100.0000 mg | ORAL_TABLET | Freq: Once | ORAL | Status: AC
  Administered 2019-03-19: 100 mg via ORAL
  Filled 2019-03-19: qty 1

## 2019-03-19 MED ORDER — AMOXICILLIN-POT CLAVULANATE 875-125 MG PO TABS: 1.0000 | ORAL_TABLET | Freq: Two times a day (BID) | ORAL | 0 refills | Status: DC

## 2019-03-19 MED ORDER — DOXYCYCLINE HYCLATE 100 MG PO CAPS: 100.0000 mg | ORAL_CAPSULE | Freq: Two times a day (BID) | ORAL | 0 refills | Status: DC

## 2019-03-19 MED ORDER — IOHEXOL 350 MG/ML SOLN
100.0000 mL | Freq: Once | INTRAVENOUS | Status: AC | PRN
  Administered 2019-03-19: 100 mL via INTRAVENOUS

## 2019-03-19 MED ORDER — AMOXICILLIN-POT CLAVULANATE 875-125 MG PO TABS
1.0000 | ORAL_TABLET | Freq: Once | ORAL | Status: AC
  Administered 2019-03-19: 1 via ORAL
  Filled 2019-03-19: qty 1

## 2019-03-19 NOTE — ED Notes (Signed)
PT ambulated well, o2 stayed at 96%

## 2019-03-19 NOTE — ED Triage Notes (Signed)
Patient states positive COVID test on Feb 16th. States worsening shortness of breath that started yesterday with fatigue with exertion. States home SpO2 of 88.

## 2019-03-19 NOTE — ED Notes (Signed)
Patient transported to CT 

## 2019-03-19 NOTE — ED Notes (Signed)
Date and time results received: 03/19/19 @ 0919  Test: Lactic Acid  Critical Value: 2.0  Name of Provider Notified: Dr Wyvonnia Dusky  Orders Received? Or Actions Taken?: see new orders

## 2019-03-19 NOTE — Discharge Instructions (Signed)
There is no signs of a blood clots.  Keep yourself quarantined at home as you have been doing.  Take the antibiotics for possible bacterial component to your pneumonia but this is likely all from coronavirus.  Return to the ED with difficulty breathing, chest pain, or other concerns.

## 2019-03-19 NOTE — ED Provider Notes (Signed)
[facility] EMERGENCY DEPARTMENT Provider Note   CSN: [id number] Arrival date & time: 03/19/19  0720     History No chief complaint on file.   Vanessa George is a 49 y.o. female.  Patient with history of asthma, hypertension, depression here with a 2-day history of progressively worsening shortness of breath.  She was diagnosed with Covid on February 16 when she had body aches, chills and runny nose.  States she saw her PCP was prescribed a 3-day course of Zithromax and a 5-day course of prednisone which she finished.  Over the past 2 days she is progressively worsening shortness of breath worse on exertion with associated chest tightness.  She reports O2 sat was 89-90 at home.  Cough productive of clear mucus.  Her chest feels tight but not painful.  She still has a cough and runny nose and sore throat and body aches.  She feels more short of breath with speaking with exertion.  Denies any cardiac history.  She has never been tested for sleep apnea.  Denies any history of CAD.  She only takes albuterol as needed for her asthma but no other breathing medications.  No leg pain or leg swelling.  She sleeps on 2 pillows at baseline.  The history is provided by the patient.       Past Medical History:  Diagnosis Date  . Acid reflux   . Anemia   . Asthma   . Depression   . Essential hypertension     There are no problems to display for this patient.   Past Surgical History:  Procedure Laterality Date  . TUBAL LIGATION       OB History    Gravida      Para      Term      Preterm      AB      Living  0     SAB      TAB      Ectopic      Multiple      Live Births              Family History  Problem Relation Age of Onset  . AAA (abdominal aortic aneurysm) Mother   . Diabetes Mother   . Heart attack Father        Age 17    Social History   Tobacco Use  . Smoking status: Never Smoker  . Smokeless tobacco: Never Used  Substance Use Topics  . Alcohol  use: No  . Drug use: No    Home Medications Prior to Admission medications   Medication Sig Start Date End Date Taking? Authorizing Provider  ALPRAZolam Duanne Moron) 0.5 MG tablet Take 1 tablet by mouth 3 (three) times daily as needed for anxiety.  06/16/17   [provider]  aspirin 81 MG chewable tablet Chew 81 mg by mouth daily.     [provider]  BREO ELLIPTA 100-25 MCG/INH AEPB Take 1 puff by mouth daily. 07/17/17   [provider]  cephALEXin (KEFLEX) 500 MG capsule Take 1 capsule (500 mg total) by mouth 3 (three) times daily. 02/04/19   Rolland Porter, MD  citalopram (CELEXA) 10 MG tablet Take 10 mg by mouth daily as needed (anxiety).  07/13/17   [provider]  losartan (COZAAR) 25 MG tablet Take 25 mg by mouth daily. 10/05/17   [provider]  Multiple Vitamin (MULTIVITAMIN WITH MINERALS) TABS tablet Take 1 tablet by mouth daily.  [provider]  pantoprazole (PROTONIX) 40 MG tablet Take 40 mg by mouth daily.  11/09/15   [provider]  phenazopyridine (PYRIDIUM) 200 MG tablet Take 1 tablet (200 mg total) by mouth 3 (three) times daily. 02/04/19   Rolland Porter, MD  VENTOLIN HFA 108 (90 BASE) MCG/ACT inhaler Inhale 2 puffs into the lungs every 4 (four) hours as needed for wheezing or shortness of breath.  02/27/13   [provider]  vitamin B-12 (CYANOCOBALAMIN) 50 MCG tablet Take 50 mcg by mouth daily.    [provider]    Allergies    Vicodin [hydrocodone-acetaminophen]  Review of Systems   Review of Systems  Constitutional: Positive for activity change, appetite change and fatigue. Negative for fever.  HENT: Positive for congestion and rhinorrhea.   Eyes: Negative for photophobia.  Respiratory: Positive for cough, chest tightness and shortness of breath.   Cardiovascular: Negative for chest pain and leg swelling.  Gastrointestinal: Negative for abdominal pain, nausea and vomiting.  Genitourinary: Negative  for dysuria and hematuria.  Musculoskeletal: Positive for arthralgias and myalgias.  Neurological: Positive for weakness. Negative for dizziness, light-headedness and headaches.   all other systems are negative except as noted in the HPI and PMH.    Physical Exam Updated Vital Signs There were no vitals taken for this visit.  Physical Exam Vitals and nursing note reviewed.  Constitutional:      General: She is in acute distress.     Appearance: She is well-developed. She is obese.     Comments: Dyspneic with conversation. Mild increased work of breathing  HENT:     Head: Normocephalic and atraumatic.     Mouth/Throat:     Pharynx: No oropharyngeal exudate.  Eyes:     Conjunctiva/sclera: Conjunctivae normal.     Pupils: Pupils are equal, round, and reactive to light.  Neck:     Comments: No meningismus. Cardiovascular:     Rate and Rhythm: Normal rate and regular rhythm.     Heart sounds: Normal heart sounds. No murmur.  Pulmonary:     Effort: Pulmonary effort is normal. No respiratory distress.     Breath sounds: Normal breath sounds. No wheezing.     Comments: Mildly increased work of breathing, dyspneic with conversation, no wheezing with good air exchange Abdominal:     Palpations: Abdomen is soft.     Tenderness: There is no abdominal tenderness. There is no guarding or rebound.  Musculoskeletal:        General: No tenderness. Normal range of motion.     Cervical back: Normal range of motion and neck supple.  Skin:    General: Skin is warm.     Capillary Refill: Capillary refill takes less than 2 seconds.  Neurological:     General: No focal deficit present.     Mental Status: She is alert and oriented to person, place, and time. Mental status is at baseline.     Cranial Nerves: No cranial nerve deficit.     Motor: No abnormal muscle tone.     Coordination: Coordination normal.     Comments: No ataxia on finger to nose bilaterally. No pronator drift. 5/5 strength  throughout. CN 2-12 intact.Equal grip strength. Sensation intact.   Psychiatric:        Behavior: Behavior normal.     ED Results / Procedures / Treatments   Labs (all labs ordered are listed, but only abnormal results are displayed) Labs Reviewed  LACTIC ACID, PLASMA -  Abnormal; Notable for the following components:      Result Value   Lactic Acid, Venous 2.0 (*)    All other components within normal limits  CBC WITH DIFFERENTIAL/PLATELET - Abnormal; Notable for the following components:   RBC 6.19 (*)    Hemoglobin 15.9 (*)    HCT 51.4 (*)    MCH 25.7 (*)    RDW 16.9 (*)    All other components within normal limits  COMPREHENSIVE METABOLIC PANEL - Abnormal; Notable for the following components:   Glucose, Bld 127 (*)    Calcium 8.6 (*)    All other components within normal limits  D-DIMER, QUANTITATIVE (NOT AT [facility]) - Abnormal; Notable for the following components:   D-Dimer, Quant 0.74 (*)    All other components within normal limits  LACTATE DEHYDROGENASE - Abnormal; Notable for the following components:   LDH 317 (*)    All other components within normal limits  FERRITIN - Abnormal; Notable for the following components:   Ferritin 319 (*)    All other components within normal limits  TRIGLYCERIDES - Abnormal; Notable for the following components:   Triglycerides 256 (*)    All other components within normal limits  FIBRINOGEN - Abnormal; Notable for the following components:   Fibrinogen 644 (*)    All other components within normal limits  C-REACTIVE PROTEIN - Abnormal; Notable for the following components:   CRP 4.1 (*)    All other components within normal limits  CULTURE, BLOOD (ROUTINE X 2)  CULTURE, BLOOD (ROUTINE X 2)  LACTIC ACID, PLASMA  PROCALCITONIN  BRAIN NATRIURETIC PEPTIDE  POC URINE PREG, ED  TROPONIN I (HIGH SENSITIVITY)  TROPONIN I (HIGH SENSITIVITY)    EKG EKG Interpretation  Date/Time:  Tuesday March 19 2019 07:56:26 EST Ventricular  Rate:  87 PR Interval:    QRS Duration: 77 QT Interval:  359 QTC Calculation: 432 R Axis:   48 Text Interpretation: Sinus rhythm Baseline wander in lead(s) V3 No significant change was found Confirmed by Ezequiel Essex [phone]) on 03/19/2019 7:59:10 AM   Radiology CT Angio Chest PE W and/or Wo Contrast  Result Date: 03/19/2019 CLINICAL DATA:  Shortness of breath, COVID positive EXAM: CT ANGIOGRAPHY CHEST WITH CONTRAST TECHNIQUE: Multidetector CT imaging of the chest was performed using the standard protocol during bolus administration of intravenous contrast. Multiplanar CT image reconstructions and MIPs were obtained to evaluate the vascular anatomy. CONTRAST:  11mL OMNIPAQUE IOHEXOL 350 MG/ML SOLN COMPARISON:  None. FINDINGS: Cardiovascular: Satisfactory opacification of the pulmonary arteries to the proximal segmental level. No evidence of pulmonary embolism. Normal heart size. No pericardial effusion. Mediastinum/Nodes: Nonenlarged and mildly enlarged mediastinal and hilar lymph nodes, which are likely reactive. There is no axillary adenopathy. Thyroid is unremarkable. Esophagus is unremarkable. Lungs/Pleura: No pleural effusion or pneumothorax. Bilateral patchy consolidation and ground-glass opacities. Central airways are patent. Upper Abdomen: No acute abnormality. Musculoskeletal: Multilevel facet hypertrophy. Review of the MIP images confirms the above findings. IMPRESSION: No acute pulmonary embolism. Multifocal pneumonia. Electronically Signed   By: Macy Mis M.D.   On: 03/19/2019 10:42   DG Chest Port 1 View  Result Date: 03/19/2019 CLINICAL DATA:  COVID.  Shortness of breath EXAM: PORTABLE CHEST 1 VIEW COMPARISON:  11/28/2018 FINDINGS: Low volume chest with underpenetration and artifact from EKG leads. Suspect patchy bilateral pneumonia. No Kerley lines or effusion. No pneumothorax. IMPRESSION: Limited portable chest with suspected patchy bilateral pneumonia. Electronically Signed    By: Neva Seat.D.  On: 03/19/2019 08:42    Procedures Procedures (including critical care time)  Medications Ordered in ED Medications - No data to display  ED Course  I have reviewed the triage vital signs and the nursing notes.  Pertinent labs & imaging results that were available during my care of the patient were reviewed by me and considered in my medical decision making (see chart for details).    MDM Rules/Calculators/A&P                     Patient Covid + February 16 here with worsening shortness of breath over the past 2 days She is not hypoxic.  Her lungs are clear.  Chest x-ray does show patchy pneumonia bilaterally which is not unexpected given her Covid diagnosis.  She is mildly tachypneic but not hypoxic.  Her EKG is sinus rhythm.  CT pulmonary embolism study will be obtained given her sudden worsening shortness of breath.  CT shows no pulmonary embolism.  Does show evidence of multifocal airspace disease consistent with a Covid infection.  Patient feels improved.  She is able to ambulate without desaturation.  Lactate has normalized.  Troponin negative x2.  Patient able to ambulate without desaturation.  CT scan as above.  We will start antibiotic for possible bacterial component of her infiltrates.  Discussed that Covid may make her feel short of breath for several weeks.  Advised to follow back up with her PCP.  Return to the ED with difficulty breathing, chest pain, not eating or drinking or other concerns. continue quarantine precautions at home.  Mariaceleste Schwark was evaluated in Emergency Department on 03/19/2019 for the symptoms described in the history of present illness. She was evaluated in the context of the global COVID-19 pandemic, which necessitated consideration that the patient might be at risk for infection with the SARS-CoV-2 virus that causes COVID-19. Institutional protocols and algorithms that pertain to the evaluation of patients at risk for  COVID-19 are in a state of rapid change based on information released by regulatory bodies including the CDC and federal and state organizations. These policies and algorithms were followed during the patient's care in the ED.  Final Clinical Impression(s) / ED Diagnoses Final diagnoses:  Pneumonia due to COVID-19 virus    Rx / DC Orders ED Discharge Orders    None       Braylen Denunzio, Annie Main, MD 03/19/19 [phone]

## 2019-03-25 LAB — CULTURE, BLOOD (ROUTINE X 2)
Culture: NO GROWTH
Culture: NO GROWTH
Special Requests: ADEQUATE
Special Requests: ADEQUATE

## 2019-04-03 ENCOUNTER — Telehealth: Payer: Self-pay | Admitting: Plastic Surgery

## 2019-05-23 ENCOUNTER — Emergency Department (HOSPITAL_COMMUNITY)
Admission: EM | Admit: 2019-05-23 | Discharge: 2019-05-23 | Disposition: A | Discharge status: Home / Self Care | Payer: 59 | Attending: Emergency Medicine | Admitting: Emergency Medicine

## 2019-05-23 ENCOUNTER — Other Ambulatory Visit: Payer: Self-pay

## 2019-05-23 ENCOUNTER — Emergency Department (HOSPITAL_COMMUNITY): Payer: 59

## 2019-05-23 ENCOUNTER — Encounter (HOSPITAL_COMMUNITY): Payer: Self-pay | Admitting: Emergency Medicine

## 2019-05-23 DIAGNOSIS — I10 Essential (primary) hypertension: Secondary | ICD-10-CM | POA: Diagnosis not present

## 2019-05-23 DIAGNOSIS — J45909 Unspecified asthma, uncomplicated: Secondary | ICD-10-CM | POA: Insufficient documentation

## 2019-05-23 DIAGNOSIS — M79622 Pain in left upper arm: Secondary | ICD-10-CM | POA: Diagnosis present

## 2019-05-23 DIAGNOSIS — Z79899 Other long term (current) drug therapy: Secondary | ICD-10-CM | POA: Diagnosis not present

## 2019-05-23 DIAGNOSIS — R0789 Other chest pain: Secondary | ICD-10-CM | POA: Insufficient documentation

## 2019-05-23 LAB — CBC WITH DIFFERENTIAL/PLATELET
Abs Immature Granulocytes: 0.02 10*3/uL (ref 0.00–0.07)
Basophils Absolute: 0.1 10*3/uL (ref 0.0–0.1)
Basophils Relative: 1 %
Eosinophils Absolute: 0.2 10*3/uL (ref 0.0–0.5)
Eosinophils Relative: 2 %
HCT: 46.1 % — ABNORMAL HIGH (ref 36.0–46.0)
Hemoglobin: 14.4 g/dL (ref 12.0–15.0)
Immature Granulocytes: 0 %
Lymphocytes Relative: 21 %
Lymphs Abs: 1.6 10*3/uL (ref 0.7–4.0)
MCH: 26.7 pg (ref 26.0–34.0)
MCHC: 31.2 g/dL (ref 30.0–36.0)
MCV: 85.5 fL (ref 80.0–100.0)
Monocytes Absolute: 0.5 10*3/uL (ref 0.1–1.0)
Monocytes Relative: 7 %
Neutro Abs: 5.4 10*3/uL (ref 1.7–7.7)
Neutrophils Relative %: 69 %
Platelets: 223 10*3/uL (ref 150–400)
RBC: 5.39 MIL/uL — ABNORMAL HIGH (ref 3.87–5.11)
RDW: 16.3 % — ABNORMAL HIGH (ref 11.5–15.5)
WBC: 7.7 10*3/uL (ref 4.0–10.5)
nRBC: 0 % (ref 0.0–0.2)

## 2019-05-23 LAB — BASIC METABOLIC PANEL
Anion gap: 9 (ref 5–15)
BUN: 12 mg/dL (ref 6–20)
CO2: 27 mmol/L (ref 22–32)
Calcium: 8.8 mg/dL — ABNORMAL LOW (ref 8.9–10.3)
Chloride: 101 mmol/L (ref 98–111)
Creatinine, Ser: 1.07 mg/dL — ABNORMAL HIGH (ref 0.44–1.00)
GFR calc Af Amer: >60 mL/min (ref >60)
GFR calc non Af Amer: >60 mL/min (ref >60)
Glucose, Bld: 153 mg/dL — ABNORMAL HIGH (ref 70–99)
Potassium: 4.1 mmol/L (ref 3.5–5.1)
Sodium: 137 mmol/L (ref 135–145)

## 2019-05-23 LAB — D-DIMER, QUANTITATIVE: D-Dimer, Quant: 0.42 ug/mL-FEU (ref 0.00–0.50)

## 2019-05-23 LAB — TROPONIN I (HIGH SENSITIVITY)
Troponin I (High Sensitivity): 3 ng/L (ref <18)
Troponin I (High Sensitivity): 3 ng/L (ref <18)

## 2019-05-23 MED ORDER — IBUPROFEN 800 MG PO TABS
800.0000 mg | ORAL_TABLET | Freq: Once | ORAL | Status: AC
  Administered 2019-05-23: 800 mg via ORAL
  Filled 2019-05-23: qty 1

## 2019-05-23 MED ORDER — IBUPROFEN 400 MG PO TABS
400.0000 mg | ORAL_TABLET | Freq: Four times a day (QID) | ORAL | 0 refills | Status: DC | PRN
Start: 2019-05-23 — End: 2021-08-17

## 2019-05-23 NOTE — ED Notes (Signed)
Pt is AAOx4 reporting pain 0 on a scale of 10.

## 2019-05-23 NOTE — ED Triage Notes (Addendum)
Pt states she is having pain from her L breast that radiates to L axilla. Describes it as "hot feeling".

## 2019-05-23 NOTE — Discharge Instructions (Signed)
There is no evidence of heart attack or blood clot in the lung.  Take the anti-inflammatories as prescribed for likely musculoskeletal chest wall pain.  You should follow-up with your primary doctor for a repeat mammogram or breast ultrasound if you continue to have pain. Return to the ED with worsening chest pain especially if is exertional, associated shortness of breath, nausea, vomiting, sweating or any other concerns

## 2019-05-23 NOTE — ED Provider Notes (Signed)
Patient CARE signed out to follow-up second troponin.  Per report patient presented with atypical chest pain likely musculoskeletal.  Delta troponin reviewed and negative.  Patient stable for outpatient follow-up.  Golda Acre, MD 05/23/19 [id number]

## 2019-05-23 NOTE — ED Provider Notes (Signed)
[facility] Provider Note   CSN: [id number] Arrival date & time: 05/23/19  0403     History Chief Complaint  Patient presents with  . Breast Pain    Vanessa George is a 49 y.o. female.  Patient with history of hypertension, asthma, Covid infection in February presenting with left-sided axillary and rib pain since last evening.  She reports this pain is in her left breast as well.  Denies any fall or injury.  States she did pick up her niece but denies any specific lifting injury.  She comes in tonight because the pain is worse with movement and worse when she has a line outside and she is not able to sleep.  She does not take anything for it.  The pain is worse with palpation worse when she lays on the left side.  She describes it as a hot feeling to her left axilla and rib cage.  There is no shortness of breath, nausea, vomiting, diaphoresis.  No cough or fever.  No leg pain or leg swelling.  Does have a history of Covid infection in February but got overdose.  No history of pulmonary embolism.  No cardiac history.  Reports negative mammogram within the past 1 year.  She has had no nipple discharge or change in skin appearance to her breast.  The history is provided by the patient.       Past Medical History:  Diagnosis Date  . Acid reflux   . Anemia   . Asthma   . Depression   . Essential hypertension     There are no problems to display for this patient.   Past Surgical History:  Procedure Laterality Date  . TUBAL LIGATION       OB History    Gravida      Para      Term      Preterm      AB      Living  0     SAB      TAB      Ectopic      Multiple      Live Births              Family History  Problem Relation Age of Onset  . AAA (abdominal aortic aneurysm) Mother   . Diabetes Mother   . Heart attack Father        Age 36    Social History   Tobacco Use  . Smoking status: Never Smoker  . Smokeless tobacco: Never Used    Substance Use Topics  . Alcohol use: No  . Drug use: No    Home Medications Prior to Admission medications   Medication Sig Start Date End Date Taking? Authorizing Provider  ALPRAZolam Duanne Moron) 0.5 MG tablet Take 1 tablet by mouth 3 (three) times daily as needed for anxiety.  06/16/17   [provider]  amoxicillin-clavulanate (AUGMENTIN) 875-125 MG tablet Take 1 tablet by mouth every 12 (twelve) hours. 03/19/19   Henny Strauch, Annie Main, MD  aspirin 81 MG chewable tablet Chew 81 mg by mouth daily.     [provider]  BREO ELLIPTA 100-25 MCG/INH AEPB Take 1 puff by mouth daily. 07/17/17   [provider]  doxycycline (VIBRAMYCIN) 100 MG capsule Take 1 capsule (100 mg total) by mouth 2 (two) times daily. 03/19/19   Crosby Bevan, Annie Main, MD  losartan (COZAAR) 25 MG tablet Take 25 mg by mouth daily. 10/05/17   [provider]  methylPREDNISolone (MEDROL DOSEPAK) 4 MG TBPK tablet Take 1-6 tablets by mouth as directed. On day 1 take 6 tablets by mouth, on day 2 take 5 tablets, on day 3 take 4 tablets, on day 4 take 3 tablets, on day 5 take 2 tablets, and on day 6 take 1 tablet, then stop 03/13/19   [provider]  Multiple Vitamin (MULTIVITAMIN WITH MINERALS) TABS tablet Take 1 tablet by mouth daily.    [provider]  pantoprazole (PROTONIX) 40 MG tablet Take 40 mg by mouth daily.  11/09/15   [provider]  VENTOLIN HFA 108 (90 BASE) MCG/ACT inhaler Inhale 2 puffs into the lungs every 4 (four) hours as needed for wheezing or shortness of breath.  02/27/13   [provider]  vitamin B-12 (CYANOCOBALAMIN) 50 MCG tablet Take 50 mcg by mouth daily.    [provider]    Allergies    Vicodin [hydrocodone-acetaminophen]  Review of Systems   Review of Systems  Constitutional: Negative for activity change, appetite change, fatigue and fever.  HENT: Negative for congestion.   Eyes: Negative for visual disturbance.  Respiratory:  Negative for cough, chest tightness and shortness of breath.   Cardiovascular: Negative for chest pain.  Gastrointestinal: Negative for abdominal pain, nausea and vomiting.  Genitourinary: Negative for dysuria and hematuria.  Musculoskeletal: Positive for arthralgias and myalgias.  Skin: Negative for rash.  Neurological: Negative for dizziness, weakness, light-headedness and headaches.   all other systems are negative except as noted in the HPI and PMH.    Physical Exam Updated Vital Signs BP (!) 159/88 (BP Location: Left Arm)   Pulse 89   Temp 98 F (36.7 C) (Oral)   Resp 18   Ht 5\' 6"  (1.676 m)   Wt 117 kg   SpO2 96%   BMI 41.64 kg/m   Physical Exam Vitals and nursing note reviewed.  Constitutional:      General: She is not in acute distress.    Appearance: She is well-developed. She is obese.  HENT:     Head: Normocephalic and atraumatic.     Mouth/Throat:     Pharynx: No oropharyngeal exudate.  Eyes:     Conjunctiva/sclera: Conjunctivae normal.     Pupils: Pupils are equal, round, and reactive to light.  Neck:     Comments: No meningismus. Cardiovascular:     Rate and Rhythm: Normal rate and regular rhythm.     Heart sounds: Normal heart sounds. No murmur.  Pulmonary:     Effort: Pulmonary effort is normal. No respiratory distress.     Breath sounds: Normal breath sounds.     Comments: Left-sided chest wall tenderness as depicted lateral and superior to her left breast.  Chaperone Ginger RN present for breast exam.  Breast appears normal to inspection.  There is no erythema or induration.  No palpable masses.  Some pain with range of motion of the left arm No rash  Chest:     Chest wall: Tenderness present.    Abdominal:     Palpations: Abdomen is soft.     Tenderness: There is no abdominal tenderness. There is no guarding or rebound.  Musculoskeletal:        General: No tenderness. Normal range of motion.     Cervical back: Normal range of motion and  neck supple.  Skin:    General: Skin is warm.  Neurological:     Mental Status: She is alert and oriented to person,  place, and time.     Cranial Nerves: No cranial nerve deficit.     Motor: No abnormal muscle tone.     Coordination: Coordination normal.     Comments: No ataxia on finger to nose bilaterally. No pronator drift. 5/5 strength throughout. CN 2-12 intact.Equal grip strength. Sensation intact.   Psychiatric:        Behavior: Behavior normal.     ED Results / Procedures / Treatments   Labs (all labs ordered are listed, but only abnormal results are displayed) Labs Reviewed  CBC WITH DIFFERENTIAL/PLATELET - Abnormal; Notable for the following components:      Result Value   RBC 5.39 (*)    HCT 46.1 (*)    RDW 16.3 (*)    All other components within normal limits  BASIC METABOLIC PANEL - Abnormal; Notable for the following components:   Glucose, Bld 153 (*)    Creatinine, Ser 1.07 (*)    Calcium 8.8 (*)    All other components within normal limits  D-DIMER, QUANTITATIVE (NOT AT [facility])  TROPONIN I (HIGH SENSITIVITY)  TROPONIN I (HIGH SENSITIVITY)    EKG EKG Interpretation  Date/Time:  Thursday May 23 2019 04:40:11 EDT Ventricular Rate:  82 PR Interval:    QRS Duration: 84 QT Interval:  357 QTC Calculation: 417 R Axis:   71 Text Interpretation: Sinus rhythm No significant change was found Confirmed by Ezequiel Essex [id number]) on 05/23/2019 4:45:52 AM   Radiology DG Chest 2 View  Result Date: 05/23/2019 CLINICAL DATA:  Chest pain EXAM: CHEST - 2 VIEW COMPARISON:  March 19, 2019 FINDINGS: The heart size and mediastinal contours are within normal limits. Both lungs are clear. The visualized skeletal structures are unremarkable. IMPRESSION: No active cardiopulmonary disease. Electronically Signed   By: Prudencio Pair M.D.   On: 05/23/2019 05:22    Procedures Procedures (including critical care time)  Medications Ordered in ED Medications  ibuprofen (ADVIL)  tablet 800 mg (has no administration in time range)    ED Course  I have reviewed the triage vital signs and the nursing notes.  Pertinent labs & imaging results that were available during my care of the patient were reviewed by me and considered in my medical decision making (see chart for details).    MDM Rules/Calculators/A&P                     Left chest wall and axillary pain since last night.  Breast itself is nontender to palpation without any induration or masses or erythema.  EKG is sinus rhythm.  Low suspicion for ACS or PE.  Patient does have Covid in February history.  Risk of clotting is increased.  This appears to be likely musculoskeletal pain.  Breast itself is nontender to palpation has no masses.  Troponin and D-dimer are negative.  Patient's pain improved with anti-inflammatories.  Second troponin pending at discharge.  Anticipate discharge home.  Advised follow-up with her PCP for further imaging of her breast as necessary.  She had a mammogram in September 2020 which was reassuring.  Advise she should follow-up with your primary doctor for further imaging of her breast including ultrasound or mammogram as breast itself was not evaluated radiographically today.  Care transferred at shift change.   Final Clinical Impression(s) / ED Diagnoses Final diagnoses:  Atypical chest pain    Rx / DC Orders ED Discharge Orders    None       Brandelyn Henne, Annie Main, MD 05/23/19  0721  

## 2019-07-23 ENCOUNTER — Encounter (HOSPITAL_COMMUNITY): Payer: Self-pay | Admitting: Emergency Medicine

## 2019-07-23 ENCOUNTER — Other Ambulatory Visit: Payer: Self-pay

## 2019-07-23 ENCOUNTER — Emergency Department (HOSPITAL_COMMUNITY): Payer: 59

## 2019-07-23 ENCOUNTER — Emergency Department (HOSPITAL_COMMUNITY)
Admission: EM | Admit: 2019-07-23 | Discharge: 2019-07-23 | Disposition: A | Discharge status: Home / Self Care | Payer: 59 | Attending: Emergency Medicine | Admitting: Emergency Medicine

## 2019-07-23 DIAGNOSIS — Z7982 Long term (current) use of aspirin: Secondary | ICD-10-CM | POA: Diagnosis not present

## 2019-07-23 DIAGNOSIS — Z79899 Other long term (current) drug therapy: Secondary | ICD-10-CM | POA: Diagnosis not present

## 2019-07-23 DIAGNOSIS — J45909 Unspecified asthma, uncomplicated: Secondary | ICD-10-CM | POA: Diagnosis not present

## 2019-07-23 DIAGNOSIS — I10 Essential (primary) hypertension: Secondary | ICD-10-CM | POA: Diagnosis present

## 2019-07-23 LAB — CBC
HCT: 48.2 % — ABNORMAL HIGH (ref 36.0–46.0)
Hemoglobin: 15 g/dL (ref 12.0–15.0)
MCH: 26.9 pg (ref 26.0–34.0)
MCHC: 31.1 g/dL (ref 30.0–36.0)
MCV: 86.5 fL (ref 80.0–100.0)
Platelets: 255 10*3/uL (ref 150–400)
RBC: 5.57 MIL/uL — ABNORMAL HIGH (ref 3.87–5.11)
RDW: 15.1 % (ref 11.5–15.5)
WBC: 9.7 10*3/uL (ref 4.0–10.5)
nRBC: 0 % (ref 0.0–0.2)

## 2019-07-23 LAB — BASIC METABOLIC PANEL
Anion gap: 9 (ref 5–15)
BUN: 7 mg/dL (ref 6–20)
CO2: 29 mmol/L (ref 22–32)
Calcium: 9.2 mg/dL (ref 8.9–10.3)
Chloride: 99 mmol/L (ref 98–111)
Creatinine, Ser: 0.97 mg/dL (ref 0.44–1.00)
GFR calc Af Amer: >60 mL/min (ref >60)
GFR calc non Af Amer: >60 mL/min (ref >60)
Glucose, Bld: 111 mg/dL — ABNORMAL HIGH (ref 70–99)
Potassium: 4 mmol/L (ref 3.5–5.1)
Sodium: 137 mmol/L (ref 135–145)

## 2019-07-23 LAB — TROPONIN I (HIGH SENSITIVITY)
Troponin I (High Sensitivity): 2 ng/L (ref <18)
Troponin I (High Sensitivity): 2 ng/L (ref <18)

## 2019-07-23 MED ORDER — AMLODIPINE BESYLATE 2.5 MG PO TABS
2.5000 mg | ORAL_TABLET | Freq: Every day | ORAL | 1 refills | Status: DC
Start: 2019-07-23 — End: 2021-08-17

## 2019-07-23 MED ORDER — AMLODIPINE BESYLATE 5 MG PO TABS
5.0000 mg | ORAL_TABLET | Freq: Once | ORAL | Status: AC
  Administered 2019-07-23: 5 mg via ORAL
  Filled 2019-07-23: qty 1

## 2019-07-23 NOTE — ED Provider Notes (Signed)
[facility] Provider Note   CSN: [id number] Arrival date & time: 07/23/19  1319     History Chief Complaint  Patient presents with  . Hypertension    Vanessa Vanessa George is a 49 y.o. Vanessa George.  Patient states that she has had a little headache and her blood pressures been running high she increased her blood pressure medicine today so the Coreg is 75 mg twice a day she is supposed to take 50 mg twice a day  The history is provided by the patient. No language interpreter was used.  Hypertension This is a recurrent problem. The current episode started more than 2 days ago. The problem occurs constantly. The problem has not changed since onset.Pertinent negatives include no chest pain, no abdominal pain and no headaches. Nothing aggravates the symptoms. Nothing relieves the symptoms. She has tried nothing for the symptoms. The treatment provided no relief.       Past Medical History:  Diagnosis Date  . Acid reflux   . Anemia   . Asthma   . Depression   . Essential hypertension     There are no problems to display for this patient.   Past Surgical History:  Procedure Laterality Date  . TUBAL LIGATION       OB History    Gravida      Para      Term      Preterm      AB      Living  0     SAB      TAB      Ectopic      Multiple      Live Births              Family History  Problem Relation Age of Onset  . AAA (abdominal aortic aneurysm) Mother   . Diabetes Mother   . Heart attack Father        Age 50    Social History   Tobacco Use  . Smoking status: Never Smoker  . Smokeless tobacco: Never Used  Vaping Use  . Vaping Use: Never used  Substance Use Topics  . Alcohol use: No  . Drug use: No    Home Medications Prior to Admission medications   Medication Sig Start Date End Date Taking? Authorizing Provider  ALPRAZolam Duanne Moron) 0.5 MG tablet Take 1 tablet by mouth 3 (three) times daily as needed for anxiety.  06/16/17    [provider]  amLODipine (NORVASC) 2.5 MG tablet Take 1 tablet (2.5 mg total) by mouth daily. 07/23/19   Milton Ferguson, MD  amoxicillin-clavulanate (AUGMENTIN) 875-125 MG tablet Take 1 tablet by mouth every 12 (twelve) hours. 03/19/19   Rancour, Annie Main, MD  aspirin 81 MG chewable tablet Chew 81 mg by mouth daily.     [provider]  BREO ELLIPTA 100-25 MCG/INH AEPB Take 1 puff by mouth daily. 07/17/17   [provider]  doxycycline (VIBRAMYCIN) 100 MG capsule Take 1 capsule (100 mg total) by mouth 2 (two) times daily. 03/19/19   Rancour, Annie Main, MD  ibuprofen (ADVIL) 400 MG tablet Take 1 tablet (400 mg total) by mouth every 6 (six) hours as needed. 05/23/19   Rancour, Annie Main, MD  losartan (COZAAR) 25 MG tablet Take 25 mg by mouth daily. 10/05/17   [provider]  methylPREDNISolone (MEDROL DOSEPAK) 4 MG TBPK tablet Take 1-6 tablets by mouth as directed. On day 1 take 6 tablets by mouth, on day 2  take 5 tablets, on day 3 take 4 tablets, on day 4 take 3 tablets, on day 5 take 2 tablets, and on day 6 take 1 tablet, then stop 03/13/19   [provider]  Multiple Vitamin (MULTIVITAMIN WITH MINERALS) TABS tablet Take 1 tablet by mouth daily.    [provider]  pantoprazole (PROTONIX) 40 MG tablet Take 40 mg by mouth daily.  11/09/15   [provider]  VENTOLIN HFA 108 (90 BASE) MCG/ACT inhaler Inhale 2 puffs into the lungs every 4 (four) hours as needed for wheezing or shortness of breath.  02/27/13   [provider]  vitamin B-12 (CYANOCOBALAMIN) 50 MCG tablet Take 50 mcg by mouth daily.    [provider]    Allergies    Vicodin [hydrocodone-acetaminophen]  Review of Systems   Review of Systems  Constitutional: Negative for appetite change and fatigue.  HENT: Negative for congestion, ear discharge and sinus pressure.   Eyes: Negative for discharge.  Respiratory: Negative for cough.   Cardiovascular: Negative for  chest pain.  Gastrointestinal: Negative for abdominal pain and diarrhea.  Genitourinary: Negative for frequency and hematuria.  Musculoskeletal: Negative for back pain.  Skin: Negative for rash.  Neurological: Negative for seizures and headaches.       Headache  Psychiatric/Behavioral: Negative for hallucinations.    Physical Exam Updated Vital Signs BP 132/69   Pulse 85   Temp 98.4 F (36.9 C) (Oral)   Resp 20   Ht 5\' 5"  (1.651 m)   Wt 119.3 kg   SpO2 98%   BMI 43.77 kg/m   Physical Exam  ED Results / Procedures / Treatments   Labs (all labs ordered are listed, but only abnormal results are displayed) Labs Reviewed  BASIC METABOLIC PANEL - Abnormal; Notable for the following components:      Result Value   Glucose, Bld 111 (*)    All other components within normal limits  CBC - Abnormal; Notable for the following components:   RBC 5.57 (*)    HCT 48.2 (*)    All other components within normal limits  TROPONIN I (HIGH SENSITIVITY)  TROPONIN I (HIGH SENSITIVITY)    EKG None  Radiology DG Chest 2 View  Result Date: 07/23/2019 CLINICAL DATA:  Hypertension. EXAM: CHEST - 2 VIEW COMPARISON:  Chest x-ray dated May 23, 2019. FINDINGS: The heart size and mediastinal contours are within normal limits. Both lungs are clear. The visualized skeletal structures are unremarkable. IMPRESSION: No active cardiopulmonary disease. Electronically Signed   By: Titus Dubin M.D.   On: 07/23/2019 14:56    Procedures Procedures (including critical care time)  Medications Ordered in ED Medications  amLODipine (NORVASC) tablet 5 mg (5 mg Oral Given 07/23/19 1738)    ED Course  I have reviewed the triage vital signs and the nursing notes.  Pertinent labs & imaging results that were available during my care of the patient were reviewed by me and considered in my medical decision making (see chart for details).    MDM Rules/Calculators/A&P                          Patient  with elevated blood pressure.  Patient improved with Norvasc.  She will be sent home on Norvasc to add to her present medications and follow-up with her PCP       This patient presents to the ED for concern of htn this involves an extensive number  of treatment options, and is a complaint that carries with it a high risk of complications and morbidity.  The differential diagnosis includes essential hypertension   Lab Tests:   I Ordered, reviewed, and interpreted labs, which included CBC and chemistries which are unremarkable  Medicines ordered:   I ordered medication Norvasc for blood pressure  Imaging Studies ordered:   Additional history obtained:   Additional history obtained from records  Previous records obtained and reviewed.  Consultations Obtained:   Reevaluation:  After the interventions stated above, I reevaluated the patient and found improved  Critical Interventions:  .   Final Clinical Impression(s) / ED Diagnoses Final diagnoses:  Essential hypertension    Rx / DC Orders ED Discharge Orders         Ordered    amLODipine (NORVASC) 2.5 MG tablet  Daily     Discontinue  Reprint     07/23/19 1901           Milton Ferguson, MD 07/23/19 1906

## 2019-07-23 NOTE — Discharge Instructions (Addendum)
Follow up with your md in one week

## 2019-07-23 NOTE — ED Notes (Signed)
Snack provided

## 2019-07-23 NOTE — ED Triage Notes (Signed)
Pt reports high blood pressure for last few days. Pt reports called pcp office and was told to come here. Pt reports intermittent headache and lightheaded sensation. nad noted. Pt reports increased losartan dose from 50mg  to 75 mg this morning and last night.

## 2019-10-10 ENCOUNTER — Other Ambulatory Visit (HOSPITAL_COMMUNITY): Payer: Self-pay | Admitting: Family Medicine

## 2019-10-10 DIAGNOSIS — Z1231 Encounter for screening mammogram for malignant neoplasm of breast: Secondary | ICD-10-CM

## 2019-10-28 ENCOUNTER — Other Ambulatory Visit: Payer: Self-pay

## 2019-10-28 ENCOUNTER — Ambulatory Visit (HOSPITAL_COMMUNITY)
Admission: RE | Admit: 2019-10-28 | Discharge: 2019-10-28 | Disposition: A | Discharge status: Home / Self Care | Payer: 59 | Source: Ambulatory Visit | Attending: Family Medicine | Admitting: Family Medicine

## 2019-10-28 DIAGNOSIS — Z1231 Encounter for screening mammogram for malignant neoplasm of breast: Secondary | ICD-10-CM | POA: Diagnosis not present

## 2020-01-23 ENCOUNTER — Other Ambulatory Visit: Payer: 59

## 2020-01-23 DIAGNOSIS — Z20822 Contact with and (suspected) exposure to covid-19: Secondary | ICD-10-CM

## 2020-01-25 LAB — SARS-COV-2, NAA 2 DAY TAT

## 2020-01-25 LAB — NOVEL CORONAVIRUS, NAA: SARS-CoV-2, NAA: NOT DETECTED

## 2020-01-30 ENCOUNTER — Ambulatory Visit: Payer: 59 | Attending: Internal Medicine

## 2020-01-30 DIAGNOSIS — Z23 Encounter for immunization: Secondary | ICD-10-CM

## 2020-01-30 NOTE — Progress Notes (Signed)
   Covid-19 Vaccination Clinic  Name:  Vanessa George    MRN: [id number] DOB: Jul 02, 1970  01/30/2020  [name] was observed post Covid-19 immunization for 30 minutes based on pre-vaccination screening without incident. She was provided with Vaccine Information Sheet and instruction to access the V-Safe system.   [name] was instructed to call 911 with any severe reactions post vaccine: Marland Kitchen Difficulty breathing  . Swelling of face and throat  . A fast heartbeat  . A bad rash all over body  . Dizziness and weakness   Immunizations Administered    Name Date Dose VIS Date Route   Moderna COVID-19 Vaccine 01/30/2020  3:37 PM 0.5 mL 11/13/2019 Intramuscular   Manufacturer: Gala Murdoch   Lot: [id number]   NDC: 26712-458-09

## 2020-02-12 ENCOUNTER — Other Ambulatory Visit: Payer: Self-pay

## 2020-02-12 ENCOUNTER — Encounter: Payer: Self-pay | Admitting: Emergency Medicine

## 2020-02-12 ENCOUNTER — Ambulatory Visit
Admission: EM | Admit: 2020-02-12 | Discharge: 2020-02-12 | Disposition: A | Discharge status: Home / Self Care | Payer: 59 | Attending: Emergency Medicine | Admitting: Emergency Medicine

## 2020-02-12 DIAGNOSIS — M25512 Pain in left shoulder: Secondary | ICD-10-CM | POA: Diagnosis not present

## 2020-02-12 DIAGNOSIS — R0789 Other chest pain: Secondary | ICD-10-CM | POA: Insufficient documentation

## 2020-02-12 DIAGNOSIS — R300 Dysuria: Secondary | ICD-10-CM | POA: Insufficient documentation

## 2020-02-12 LAB — POCT URINALYSIS DIP (MANUAL ENTRY)
Bilirubin, UA: NEGATIVE
Blood, UA: NEGATIVE
Glucose, UA: NEGATIVE mg/dL
Ketones, POC UA: NEGATIVE mg/dL
Nitrite, UA: NEGATIVE
Protein Ur, POC: NEGATIVE mg/dL
Spec Grav, UA: >=1.03 — AB (ref 1.010–1.025)
Urobilinogen, UA: 0.2 E.U./dL
pH, UA: 5.5 (ref 5.0–8.0)

## 2020-02-12 NOTE — Discharge Instructions (Addendum)
°  If you having chest pain please go to the ER for further evaluation Urine culture was sent we will call you with abnormal result Follow-up with PCP Take OTC Tylenol as needed for shoulder pain Return or go to ER if you develop any new or worsening of your symptoms

## 2020-02-12 NOTE — ED Triage Notes (Signed)
Also states her urine has not cleared up from UTI.

## 2020-02-12 NOTE — ED Triage Notes (Signed)
Left shoulder pain and pain between shoulder blades  Just below neck area.  Denies SOB.  Pain comes and goes and has been hurting for a couple of days.

## 2020-02-12 NOTE — ED Provider Notes (Signed)
[place]   [id number] 02/12/20 Arrival Time: 57   No chief complaint on file.    SUBJECTIVE: History from: patient.  Paizlee Kinder is a 50 y.o. female who presented to the urgent care for complaint of left shoulder and shoulder blade pain for the past few days.  Reports she had COVID-vaccine on the left deltoid 3 weeks ago.  Denies any precipitating, trauma or injury.  Located pain to the left shoulder and left chest.  Has not tried any OTC medication.  Denies alleviating or aggravating factors.  Denies previous symptoms in the past.   Denies fever, chills, fatigue, SOB, diaphoretic, wheezing, nausea, changes in bowel or bladder habits.    She is also complaining of frequent urination and dysuria for the past few days.  States she has currently been taking Macrobid with no symptom improvement.  Reports similar symptoms in the past that improved with antibiotic treatment.  Denies alleviating or aggravating factors.  Denies chills, fever, nausea, vomiting, diarrhea.   ROS: As per HPI.  All other pertinent ROS negative.      Past Medical History:  Diagnosis Date  . Acid reflux   . Anemia   . Asthma   . Depression   . Essential hypertension    Past Surgical History:  Procedure Laterality Date  . TUBAL LIGATION     Allergies  Allergen Reactions  . Vicodin [Hydrocodone-Acetaminophen] Other (See Comments)    hallucinations    No current facility-administered medications on file prior to encounter.   Current Outpatient Medications on File Prior to Encounter  Medication Sig Dispense Refill  . ALPRAZolam (XANAX) 0.5 MG tablet Take 1 tablet by mouth 3 (three) times daily as needed for anxiety.   1  . amLODipine (NORVASC) 2.5 MG tablet Take 1 tablet (2.5 mg total) by mouth daily. 30 tablet 1  . amoxicillin-clavulanate (AUGMENTIN) 875-125 MG tablet Take 1 tablet by mouth every 12 (twelve) hours. 10 tablet 0  . aspirin 81 MG chewable tablet Chew 81 mg by mouth daily.     Marland Kitchen  BREO ELLIPTA 100-25 MCG/INH AEPB Take 1 puff by mouth daily.  3  . doxycycline (VIBRAMYCIN) 100 MG capsule Take 1 capsule (100 mg total) by mouth 2 (two) times daily. 20 capsule 0  . ibuprofen (ADVIL) 400 MG tablet Take 1 tablet (400 mg total) by mouth every 6 (six) hours as needed. 30 tablet 0  . losartan (COZAAR) 25 MG tablet Take 25 mg by mouth daily.  5  . methylPREDNISolone (MEDROL DOSEPAK) 4 MG TBPK tablet Take 1-6 tablets by mouth as directed. On day 1 take 6 tablets by mouth, on day 2 take 5 tablets, on day 3 take 4 tablets, on day 4 take 3 tablets, on day 5 take 2 tablets, and on day 6 take 1 tablet, then stop    . Multiple Vitamin (MULTIVITAMIN WITH MINERALS) TABS tablet Take 1 tablet by mouth daily.    . pantoprazole (PROTONIX) 40 MG tablet Take 40 mg by mouth daily.   0  . VENTOLIN HFA 108 (90 BASE) MCG/ACT inhaler Inhale 2 puffs into the lungs every 4 (four) hours as needed for wheezing or shortness of breath.     . vitamin B-12 (CYANOCOBALAMIN) 50 MCG tablet Take 50 mcg by mouth daily.     Social History   Socioeconomic History  . Marital status: Married    Spouse name: Not on file  . Number of children: Not on file  . Years of  education: Not on file  . Highest education level: Not on file  Occupational History  . Not on file  Tobacco Use  . Smoking status: Never Smoker  . Smokeless tobacco: Never Used  Vaping Use  . Vaping Use: Never used  Substance and Sexual Activity  . Alcohol use: No  . Drug use: No  . Sexual activity: Not on file  Other Topics Concern  . Not on file  Social History Narrative  . Not on file   Social Determinants of Health   Financial Resource Strain: Not on file  Food Insecurity: Not on file  Transportation Needs: Not on file  Physical Activity: Not on file  Stress: Not on file  Social Connections: Not on file  Intimate Partner Violence: Not on file   Family History  Problem Relation Age of Onset  . AAA (abdominal aortic aneurysm)  Mother   . Diabetes Mother   . Heart attack Father        Age 96    OBJECTIVE:  Vitals:   02/12/20 1412 02/12/20 1421  BP: (!) 145/82   Pulse: 89   Resp: 15   Temp: 97.9 F (36.6 C)   SpO2: 98%   Weight:  259 lb (117.5 kg)  Height:  5\' 5"  (1.651 m)     Physical Exam Vitals and nursing note reviewed.  Constitutional:      General: She is not in acute distress.    Appearance: Normal appearance. She is normal weight. She is not ill-appearing, toxic-appearing or diaphoretic.  HENT:     Head: Normocephalic.     Right Ear: Tympanic membrane, ear canal and external ear normal. There is no impacted cerumen.     Left Ear: Tympanic membrane, ear canal and external ear normal. There is no impacted cerumen.  Cardiovascular:     Rate and Rhythm: Normal rate and regular rhythm.     Pulses: Normal pulses.     Heart sounds: Normal heart sounds. No murmur heard. No friction rub. No gallop.   Pulmonary:     Effort: Pulmonary effort is normal. No respiratory distress.     Breath sounds: Normal breath sounds. No stridor. No wheezing, rhonchi or rales.  Chest:     Chest wall: No tenderness.  Abdominal:     General: Abdomen is flat. Bowel sounds are normal. There is no distension.     Palpations: There is no mass.     Tenderness: There is no abdominal tenderness.     Hernia: No hernia is present.  Neurological:     Mental Status: She is alert and oriented to person, place, and time.     LABS:  Results for orders placed or performed during the hospital encounter of 02/12/20 (from the past 24 hour(s))  POCT urinalysis dipstick     Status: Abnormal   Collection Time: 02/12/20  2:39 PM  Result Value Ref Range   Color, UA yellow yellow   Clarity, UA clear clear   Glucose, UA negative negative mg/dL   Bilirubin, UA negative negative   Ketones, POC UA negative negative mg/dL   Spec Grav, UA >=1.030 (A) 1.010 - 1.025   Blood, UA negative negative   pH, UA 5.5 5.0 - 8.0   Protein Ur,  POC negative negative mg/dL   Urobilinogen, UA 0.2 0.2 or 1.0 E.U./dL   Nitrite, UA Negative Negative   Leukocytes, UA Trace (A) Negative     ASSESSMENT & PLAN:  1. Dysuria   2.  Pain in joint of left shoulder   3. Other chest pain     No orders of the defined types were placed in this encounter.  Patient is stable at discharge.  Chest pain is reproducible on palpation on her chest.  Symptoms are more likely  from [place] vaccination.  ED EKG showed normal sinus rhythm with no ST elevation.   Discharge instructions  If you having chest pain please go to the ER for further evaluation Urine culture was sent we will call you with abnormal result Follow-up with PCP Take OTC Tylenol as needed for shoulder pain Return or go to ER if you develop any new or worsening of your symptoms   Reviewed expectations re: course of current medical issues. Questions answered. Outlined signs and symptoms indicating need for more acute intervention. Patient verbalized understanding. After Visit Summary given.         Emerson Monte, FNP 02/12/20 1536

## 2020-02-14 LAB — URINE CULTURE: Culture: NO GROWTH

## 2020-02-27 ENCOUNTER — Ambulatory Visit: Payer: 59

## 2020-03-19 ENCOUNTER — Ambulatory Visit: Payer: 59 | Attending: Internal Medicine

## 2020-03-19 DIAGNOSIS — Z23 Encounter for immunization: Secondary | ICD-10-CM

## 2020-03-19 NOTE — Progress Notes (Signed)
   Covid-19 Vaccination Clinic  Name:  Vanessa George    MRN: [id number] DOB: [birth year]  03/19/2020  Vanessa George was observed post Covid-19 immunization for 15 minutes without incident. She was provided with Vaccine Information Sheet and instruction to access the V-Safe system.   Vanessa George was instructed to call 911 with any severe reactions post vaccine: Marland Kitchen Difficulty breathing  . Swelling of face and throat  . A fast heartbeat  . A bad rash all over body  . Dizziness and weakness   Immunizations Administered    Name Date Dose VIS Date Route   Moderna COVID-19 Vaccine 03/19/2020  3:42 PM 0.5 mL 11/13/2019 Intramuscular   Manufacturer: Moderna   Lot: [id number]   [place]: [id number]

## 2020-07-09 LAB — COLOGUARD: COLOGUARD: NEGATIVE

## 2020-10-13 ENCOUNTER — Other Ambulatory Visit (HOSPITAL_COMMUNITY): Payer: Self-pay | Admitting: Family Medicine

## 2020-10-13 DIAGNOSIS — Z1231 Encounter for screening mammogram for malignant neoplasm of breast: Secondary | ICD-10-CM

## 2020-11-02 ENCOUNTER — Ambulatory Visit (HOSPITAL_COMMUNITY)
Admission: RE | Admit: 2020-11-02 | Discharge: 2020-11-02 | Disposition: A | Discharge status: Home / Self Care | Payer: 59 | Source: Ambulatory Visit | Attending: Family Medicine | Admitting: Family Medicine

## 2020-11-02 ENCOUNTER — Other Ambulatory Visit: Payer: Self-pay

## 2020-11-02 DIAGNOSIS — Z1231 Encounter for screening mammogram for malignant neoplasm of breast: Secondary | ICD-10-CM | POA: Insufficient documentation

## 2020-12-04 ENCOUNTER — Emergency Department (HOSPITAL_COMMUNITY)
Admission: EM | Admit: 2020-12-04 | Discharge: 2020-12-04 | Disposition: A | Discharge status: Home / Self Care | Payer: 59 | Attending: Emergency Medicine | Admitting: Emergency Medicine

## 2020-12-04 ENCOUNTER — Emergency Department (HOSPITAL_COMMUNITY): Payer: 59

## 2020-12-04 ENCOUNTER — Other Ambulatory Visit: Payer: Self-pay

## 2020-12-04 ENCOUNTER — Encounter (HOSPITAL_COMMUNITY): Payer: Self-pay | Admitting: Emergency Medicine

## 2020-12-04 DIAGNOSIS — Z7982 Long term (current) use of aspirin: Secondary | ICD-10-CM | POA: Insufficient documentation

## 2020-12-04 DIAGNOSIS — Z79899 Other long term (current) drug therapy: Secondary | ICD-10-CM | POA: Insufficient documentation

## 2020-12-04 DIAGNOSIS — E119 Type 2 diabetes mellitus without complications: Secondary | ICD-10-CM | POA: Insufficient documentation

## 2020-12-04 DIAGNOSIS — I10 Essential (primary) hypertension: Secondary | ICD-10-CM | POA: Diagnosis not present

## 2020-12-04 DIAGNOSIS — Z7984 Long term (current) use of oral hypoglycemic drugs: Secondary | ICD-10-CM | POA: Insufficient documentation

## 2020-12-04 DIAGNOSIS — R0602 Shortness of breath: Secondary | ICD-10-CM | POA: Diagnosis not present

## 2020-12-04 DIAGNOSIS — J45909 Unspecified asthma, uncomplicated: Secondary | ICD-10-CM | POA: Diagnosis not present

## 2020-12-04 DIAGNOSIS — R0789 Other chest pain: Secondary | ICD-10-CM | POA: Insufficient documentation

## 2020-12-04 DIAGNOSIS — R079 Chest pain, unspecified: Secondary | ICD-10-CM

## 2020-12-04 HISTORY — DX: Type 2 diabetes mellitus without complications: E11.9

## 2020-12-04 LAB — CBC WITH DIFFERENTIAL/PLATELET
Abs Immature Granulocytes: 0.04 10*3/uL (ref 0.00–0.07)
Basophils Absolute: 0.1 10*3/uL (ref 0.0–0.1)
Basophils Relative: 1 %
Eosinophils Absolute: 0.1 10*3/uL (ref 0.0–0.5)
Eosinophils Relative: 1 %
HCT: 48.6 % — ABNORMAL HIGH (ref 36.0–46.0)
Hemoglobin: 15.3 g/dL — ABNORMAL HIGH (ref 12.0–15.0)
Immature Granulocytes: 0 %
Lymphocytes Relative: 21 %
Lymphs Abs: 2.1 10*3/uL (ref 0.7–4.0)
MCH: 26.7 pg (ref 26.0–34.0)
MCHC: 31.5 g/dL (ref 30.0–36.0)
MCV: 84.8 fL (ref 80.0–100.0)
Monocytes Absolute: 0.5 10*3/uL (ref 0.1–1.0)
Monocytes Relative: 5 %
Neutro Abs: 7.1 10*3/uL (ref 1.7–7.7)
Neutrophils Relative %: 72 %
Platelets: 223 10*3/uL (ref 150–400)
RBC: 5.73 MIL/uL — ABNORMAL HIGH (ref 3.87–5.11)
RDW: 15.6 % — ABNORMAL HIGH (ref 11.5–15.5)
WBC: 9.9 10*3/uL (ref 4.0–10.5)
nRBC: 0 % (ref 0.0–0.2)

## 2020-12-04 LAB — TROPONIN I (HIGH SENSITIVITY)
Troponin I (High Sensitivity): <2 ng/L (ref <18)
Troponin I (High Sensitivity): 2 ng/L (ref <18)

## 2020-12-04 LAB — BASIC METABOLIC PANEL
Anion gap: 9 (ref 5–15)
BUN: 10 mg/dL (ref 6–20)
CO2: 25 mmol/L (ref 22–32)
Calcium: 9.1 mg/dL (ref 8.9–10.3)
Chloride: 102 mmol/L (ref 98–111)
Creatinine, Ser: 1.01 mg/dL — ABNORMAL HIGH (ref 0.44–1.00)
GFR, Estimated: >60 mL/min (ref >60)
Glucose, Bld: 258 mg/dL — ABNORMAL HIGH (ref 70–99)
Potassium: 4.2 mmol/L (ref 3.5–5.1)
Sodium: 136 mmol/L (ref 135–145)

## 2020-12-04 LAB — URINALYSIS, ROUTINE W REFLEX MICROSCOPIC
Bacteria, UA: NONE SEEN
Bilirubin Urine: NEGATIVE
Glucose, UA: 50 mg/dL — AB
Hgb urine dipstick: NEGATIVE
Ketones, ur: NEGATIVE mg/dL
Nitrite: NEGATIVE
Protein, ur: NEGATIVE mg/dL
Specific Gravity, Urine: 1.017 (ref 1.005–1.030)
pH: 5 (ref 5.0–8.0)

## 2020-12-04 NOTE — ED Provider Notes (Signed)
[facility] EMERGENCY DEPARTMENT Provider Note   CSN: [id number] Arrival date & time: 12/04/20  1023     History Chief Complaint  Patient presents with   Chest Pain    Vanessa George is a 50 y.o. female.   Chest Pain Associated symptoms: shortness of breath   Associated symptoms: no abdominal pain, no back pain, no cough and no weakness   Patient presents with chest pain.  Anterior chest.  Comes and goes.  Is come on both with exertion and at rest.  Not necessarily exertional.  Dull.  No fevers.  No coughing.  Does feel little weak all over.  Had episode yesterday and again today.  States she has had some episodes like this in the past with no cardiac cause found.  No swelling her legs.  States she has had an echocardiogram previously.    Past Medical History:  Diagnosis Date   Acid reflux    Anemia    Asthma    Depression    Diabetes mellitus without complication ([place])    Essential hypertension     There are no problems to display for this patient.   Past Surgical History:  Procedure Laterality Date   TUBAL LIGATION       OB History     Gravida      Para      Term      Preterm      AB      Living  0      SAB      IAB      Ectopic      Multiple      Live Births              Family History  Problem Relation Age of Onset   AAA (abdominal aortic aneurysm) Mother    Diabetes Mother    Heart attack Father        Age 55    Social History   Tobacco Use   Smoking status: Never   Smokeless tobacco: Never  Vaping Use   Vaping Use: Never used  Substance Use Topics   Alcohol use: No   Drug use: No    Home Medications Prior to Admission medications   Medication Sig Start Date End Date Taking? Authorizing Provider  ALPRAZolam Duanne Moron) 0.5 MG tablet Take 1 tablet by mouth 3 (three) times daily as needed for anxiety.  06/16/17  Yes [provider]  amLODipine-olmesartan (AZOR) 5-40 MG tablet Take 1 tablet by mouth daily. 11/10/20  Yes  [provider]  aspirin 81 MG chewable tablet Chew 81 mg by mouth daily.    Yes [provider]  BREO ELLIPTA 100-25 MCG/INH AEPB Take 1 puff by mouth daily. 07/17/17  Yes [provider]  metFORMIN (GLUCOPHAGE-XR) 500 MG 24 hr tablet Take 500 mg by mouth daily. 10/09/20  Yes [provider]  Multiple Vitamin (MULTIVITAMIN WITH MINERALS) TABS tablet Take 1 tablet by mouth daily.   Yes [provider]  pantoprazole (PROTONIX) 40 MG tablet Take 40 mg by mouth daily.  11/09/15  Yes [provider]  penicillin v potassium (VEETID) 500 MG tablet Take 500 mg by mouth 2 (two) times daily. 10/09/20  Yes [provider]  VENTOLIN HFA 108 (90 BASE) MCG/ACT inhaler Inhale 2 puffs into the lungs every 4 (four) hours as needed for wheezing or shortness of breath.  02/27/13  Yes [provider]  vitamin B-12 (CYANOCOBALAMIN) 50 MCG tablet  Take 50 mcg by mouth daily.   Yes [provider]  amLODipine (NORVASC) 2.5 MG tablet Take 1 tablet (2.5 mg total) by mouth daily. Patient not taking: No sig reported 07/23/19   Milton Ferguson, MD  amoxicillin-clavulanate (AUGMENTIN) 875-125 MG tablet Take 1 tablet by mouth every 12 (twelve) hours. Patient not taking: No sig reported 03/19/19   Rancour, Annie Main, MD  doxycycline (VIBRAMYCIN) 100 MG capsule Take 1 capsule (100 mg total) by mouth 2 (two) times daily. Patient not taking: No sig reported 03/19/19   Ezequiel Essex, MD  ibuprofen (ADVIL) 400 MG tablet Take 1 tablet (400 mg total) by mouth every 6 (six) hours as needed. Patient not taking: No sig reported 05/23/19   Ezequiel Essex, MD    Allergies    Vicodin [hydrocodone-acetaminophen]  Review of Systems   Review of Systems  Constitutional:  Negative for appetite change.  HENT:  Negative for congestion.   Respiratory:  Positive for shortness of breath. Negative for cough.   Cardiovascular:  Positive for chest pain.  Gastrointestinal:   Negative for abdominal pain.  Genitourinary:  Negative for flank pain.  Musculoskeletal:  Negative for back pain.  Skin:  Negative for rash.  Neurological:  Negative for weakness.  Psychiatric/Behavioral:  Negative for confusion.    Physical Exam Updated Vital Signs BP 119/77   Pulse 73   Resp (!) 21   Ht 5\' 5"  (1.651 m)   Wt 114.8 kg   SpO2 98%   BMI 42.10 kg/m   Physical Exam Vitals and nursing note reviewed.  HENT:     Head: Normocephalic.  Cardiovascular:     Rate and Rhythm: Normal rate and regular rhythm.  Pulmonary:     Breath sounds: No decreased breath sounds or wheezing.  Chest:     Chest wall: No tenderness.  Abdominal:     Tenderness: There is no abdominal tenderness.  Musculoskeletal:     Right lower leg: No edema.     Left lower leg: No edema.  Skin:    General: Skin is warm.     Capillary Refill: Capillary refill takes less than 2 seconds.  Neurological:     Mental Status: She is alert and oriented to person, place, and time.    ED Results / Procedures / Treatments   Labs (all labs ordered are listed, but only abnormal results are displayed) Labs Reviewed  CBC WITH DIFFERENTIAL/PLATELET - Abnormal; Notable for the following components:      Result Value   RBC 5.73 (*)    Hemoglobin 15.3 (*)    HCT 48.6 (*)    RDW 15.6 (*)    All other components within normal limits  BASIC METABOLIC PANEL - Abnormal; Notable for the following components:   Glucose, Bld 258 (*)    Creatinine, Ser 1.01 (*)    All other components within normal limits  URINALYSIS, ROUTINE W REFLEX MICROSCOPIC - Abnormal; Notable for the following components:   Glucose, UA 50 (*)    Leukocytes,Ua TRACE (*)    All other components within normal limits  TROPONIN I (HIGH SENSITIVITY)  TROPONIN I (HIGH SENSITIVITY)    EKG EKG Interpretation  Date/Time:  Friday December 04 2020 10:43:55 EST Ventricular Rate:  87 PR Interval:  173 QRS Duration: 80 QT Interval:  344 QTC  Calculation: 414 R Axis:   74 Text Interpretation: Sinus rhythm Baseline wander in lead(s) V6 Confirmed by Davonna Belling [phone]) on 12/04/2020 10:46:15 AM  Radiology DG Chest Portable  1 View  Result Date: 12/04/2020 CLINICAL DATA:  Shortness of breath, chest EXAM: PORTABLE CHEST 1 VIEW COMPARISON:  07/23/2019 FINDINGS: Cardiac and mediastinal contours are within normal limits. No focal pulmonary opacity. No pleural effusion or pneumothorax. No acute osseous abnormality. IMPRESSION: No acute cardiopulmonary process. Electronically Signed   By: Merilyn Baba M.D.   On: 12/04/2020 10:58    Procedures Procedures   Medications Ordered in ED Medications - No data to display  ED Course  I have reviewed the triage vital signs and the nursing notes.  Pertinent labs & imaging results that were available during my care of the patient were reviewed by me and considered in my medical decision making (see chart for details).    MDM Rules/Calculators/A&P                           Patient presents with chest pain shortness of breath and some generalized weakness.  He has had for the last couple days.  Also some right-sided back pain.Urine does not show infection.  Although has had a little more frequency of the urine.  Sugar is a little high.  EKG reassuring.  Troponin negative x2.  I think overall is low risk chest pain and we worked up as an outpatient.  Doubt pneumonia.  Doubt pulm embolisms.  Discharge home. Final Clinical Impression(s) / ED Diagnoses Final diagnoses:  Nonspecific chest pain    Rx / DC Orders ED Discharge Orders     None        Davonna Belling, MD 12/04/20 1357

## 2020-12-04 NOTE — ED Triage Notes (Signed)
Pt c/o chest pain that started this morning w/ SOB. Pt also c/o of UTI symptoms that started 3days ago.

## 2021-05-15 ENCOUNTER — Emergency Department (HOSPITAL_COMMUNITY): Payer: 59

## 2021-05-15 ENCOUNTER — Other Ambulatory Visit: Payer: Self-pay

## 2021-05-15 ENCOUNTER — Emergency Department (HOSPITAL_COMMUNITY)
Admission: EM | Admit: 2021-05-15 | Discharge: 2021-05-15 | Disposition: A | Discharge status: Home / Self Care | Payer: 59 | Attending: Emergency Medicine | Admitting: Emergency Medicine

## 2021-05-15 ENCOUNTER — Encounter (HOSPITAL_COMMUNITY): Payer: Self-pay

## 2021-05-15 DIAGNOSIS — Z7984 Long term (current) use of oral hypoglycemic drugs: Secondary | ICD-10-CM | POA: Diagnosis not present

## 2021-05-15 DIAGNOSIS — E119 Type 2 diabetes mellitus without complications: Secondary | ICD-10-CM | POA: Diagnosis not present

## 2021-05-15 DIAGNOSIS — I10 Essential (primary) hypertension: Secondary | ICD-10-CM | POA: Diagnosis not present

## 2021-05-15 DIAGNOSIS — J45909 Unspecified asthma, uncomplicated: Secondary | ICD-10-CM | POA: Diagnosis not present

## 2021-05-15 DIAGNOSIS — Z79899 Other long term (current) drug therapy: Secondary | ICD-10-CM | POA: Insufficient documentation

## 2021-05-15 DIAGNOSIS — R519 Headache, unspecified: Secondary | ICD-10-CM | POA: Diagnosis present

## 2021-05-15 DIAGNOSIS — Z7982 Long term (current) use of aspirin: Secondary | ICD-10-CM | POA: Insufficient documentation

## 2021-05-15 LAB — CBC WITH DIFFERENTIAL/PLATELET
Abs Immature Granulocytes: 0.03 10*3/uL (ref 0.00–0.07)
Basophils Absolute: 0.1 10*3/uL (ref 0.0–0.1)
Basophils Relative: 1 %
Eosinophils Absolute: 0.1 10*3/uL (ref 0.0–0.5)
Eosinophils Relative: 2 %
HCT: 46.9 % — ABNORMAL HIGH (ref 36.0–46.0)
Hemoglobin: 14.9 g/dL (ref 12.0–15.0)
Immature Granulocytes: 0 %
Lymphocytes Relative: 22 %
Lymphs Abs: 2 10*3/uL (ref 0.7–4.0)
MCH: 26.5 pg (ref 26.0–34.0)
MCHC: 31.8 g/dL (ref 30.0–36.0)
MCV: 83.5 fL (ref 80.0–100.0)
Monocytes Absolute: 0.6 10*3/uL (ref 0.1–1.0)
Monocytes Relative: 7 %
Neutro Abs: 6.1 10*3/uL (ref 1.7–7.7)
Neutrophils Relative %: 68 %
Platelets: 227 10*3/uL (ref 150–400)
RBC: 5.62 MIL/uL — ABNORMAL HIGH (ref 3.87–5.11)
RDW: 15.7 % — ABNORMAL HIGH (ref 11.5–15.5)
WBC: 9 10*3/uL (ref 4.0–10.5)
nRBC: 0 % (ref 0.0–0.2)

## 2021-05-15 LAB — URINALYSIS, ROUTINE W REFLEX MICROSCOPIC
Bilirubin Urine: NEGATIVE
Glucose, UA: NEGATIVE mg/dL
Hgb urine dipstick: NEGATIVE
Ketones, ur: NEGATIVE mg/dL
Leukocytes,Ua: NEGATIVE
Nitrite: NEGATIVE
Protein, ur: NEGATIVE mg/dL
Specific Gravity, Urine: 1.004 — ABNORMAL LOW (ref 1.005–1.030)
pH: 7 (ref 5.0–8.0)

## 2021-05-15 LAB — BASIC METABOLIC PANEL
Anion gap: 6 (ref 5–15)
BUN: 9 mg/dL (ref 6–20)
CO2: 29 mmol/L (ref 22–32)
Calcium: 9.1 mg/dL (ref 8.9–10.3)
Chloride: 103 mmol/L (ref 98–111)
Creatinine, Ser: 0.96 mg/dL (ref 0.44–1.00)
GFR, Estimated: >60 mL/min (ref >60)
Glucose, Bld: 87 mg/dL (ref 70–99)
Potassium: 3.8 mmol/L (ref 3.5–5.1)
Sodium: 138 mmol/L (ref 135–145)

## 2021-05-15 MED ORDER — ACETAMINOPHEN 325 MG PO TABS
650.0000 mg | ORAL_TABLET | Freq: Once | ORAL | Status: AC
  Administered 2021-05-15: 650 mg via ORAL
  Filled 2021-05-15: qty 2

## 2021-05-15 NOTE — Discharge Instructions (Addendum)
Your lab tests and exam today, ekg and chest xray are all reassuring with no evidence of any current adverse events from your elevated blood pressure.  I recommend a recheck with your primary MD within the next several weeks.  In the interim,  check your blood pressure once daily (vary the times) and keep track of these numbers to share with your doctor.  This will help them decide if you need any further medication adjustment.  ?

## 2021-05-15 NOTE — ED Triage Notes (Addendum)
Reports having headache and hypertension.  Reports took prescribed meds and took a left over amlodipine 2.'5mg'$  at 11am and BP will not come down.  Denies CP or SOB ?\ ?Patient has had a uti since beginning of April has done cipro and augmentin and now started on macrobid.  ?

## 2021-05-15 NOTE — ED Provider Notes (Signed)
?[place] ?Provider Note ? ? ?CSN: [id number] ?Arrival date & time: 05/15/21  1325 ? ?  ? ?History ? ?Chief Complaint  ?Patient presents with  ? Hypertension  ? ? ?Vanessa George is a 51 y.o. female with a history significant for asthma, GERD, type 2 diabetes and hypertension presenting for evaluation of elevated blood pressure and a mild left-sided headache.  She woke this morning with a left temple headache which has been mild in character, but intermittent throughout the day.  She has taken her blood pressure and it was noted to be elevated, initially it was in the range of 180/95.  Upon first arrival here her blood pressure was 171/82.  She has been compliant with her blood pressure medications, she is currently taking amlodipine and olmesartan, but notes she just picked up a refill of her blood pressure medication and discovered it was amlodipine and benazepril, she is still taking the old medication and is unsure of the reason why her PCP changed her medication.  She denies chest pain, shortness of breath, vision changes, peripheral edema, dizziness, palpitations. ? ?The history is provided by the patient.  ? ?  ? ?Home Medications ?Prior to Admission medications   ?Medication Sig Start Date End Date Taking? Authorizing Provider  ?ALPRAZolam (XANAX) 0.5 MG tablet Take 1 tablet by mouth 3 (three) times daily as needed for anxiety.  06/16/17   [provider]  ?amLODipine (NORVASC) 2.5 MG tablet Take 1 tablet (2.5 mg total) by mouth daily. ?Patient not taking: No sig reported 07/23/19   Milton Ferguson, MD  ?amLODipine-benazepril (LOTREL) 5-40 MG capsule Take 1 capsule by mouth daily. 05/07/21   [provider]  ?amLODipine-olmesartan (AZOR) 5-40 MG tablet Take 1 tablet by mouth daily. 11/10/20   [provider]  ?amoxicillin (AMOXIL) 500 MG capsule Take 500 mg by mouth 3 (three) times daily. 12/31/20   [provider]  ?amoxicillin-clavulanate (AUGMENTIN) 875-125  MG tablet Take 1 tablet by mouth every 12 (twelve) hours. ?Patient not taking: No sig reported 03/19/19   Ezequiel Essex, MD  ?aspirin 81 MG chewable tablet Chew 81 mg by mouth daily.     [provider]  ?atorvastatin (LIPITOR) 10 MG tablet Take 10 mg by mouth at bedtime. 02/01/21   [provider]  ?[place] Patter 100-25 MCG/INH AEPB Take 1 puff by mouth daily. 07/17/17   [provider]  ?chlorhexidine (PERIDEX) 0.12 % solution SMARTSIG:15 Milliliter(s) By Mouth Morning-Evening 12/31/20   [provider]  ?ciprofloxacin (CIPRO) 500 MG tablet Take 500 mg by mouth 2 (two) times daily. 04/20/21   [provider]  ?doxycycline (VIBRAMYCIN) 100 MG capsule Take 1 capsule (100 mg total) by mouth 2 (two) times daily. ?Patient not taking: No sig reported 03/19/19   Ezequiel Essex, MD  ?gentamicin (GARAMYCIN) 0.3 % ophthalmic solution SMARTSIG:3 Drop(s) In Eye(s) 4 Times Daily 03/04/21   [provider]  ?HYDROcodone-acetaminophen (NORCO/VICODIN) 5-325 MG tablet Take 1 tablet by mouth every 4 (four) hours as needed. 12/31/20   [provider]  ?ibuprofen (ADVIL) 400 MG tablet Take 1 tablet (400 mg total) by mouth every 6 (six) hours as needed. ?Patient not taking: No sig reported 05/23/19   Ezequiel Essex, MD  ?ibuprofen (ADVIL) 800 MG tablet Take 800 mg by mouth 3 (three) times daily as needed. 04/20/21   [provider]  ?metFORMIN (GLUCOPHAGE-XR) 500 MG 24 hr tablet Take 500 mg by mouth daily. 10/09/20   [provider]  ?  Multiple Vitamin (MULTIVITAMIN WITH MINERALS) TABS tablet Take 1 tablet by mouth daily.    [provider]  ?nitrofurantoin, macrocrystal-monohydrate, (MACROBID) 100 MG capsule Take 100 mg by mouth every 12 (twelve) hours. 05/14/21   [provider]  ?pantoprazole (PROTONIX) 40 MG tablet Take 40 mg by mouth daily.  11/09/15   [provider]  ?penicillin v potassium (VEETID) 500 MG tablet Take 500 mg by  mouth 2 (two) times daily. 10/09/20   [provider]  ?VENTOLIN HFA 108 (90 BASE) MCG/ACT inhaler Inhale 2 puffs into the lungs every 4 (four) hours as needed for wheezing or shortness of breath.  02/27/13   [provider]  ?vitamin B-12 (CYANOCOBALAMIN) 50 MCG tablet Take 50 mcg by mouth daily.    [provider]  ?   ? ?Allergies    ?Vicodin [hydrocodone-acetaminophen]   ? ?Review of Systems   ?Review of Systems  ?Constitutional:  Negative for fever.  ?HENT:  Negative for congestion.   ?Eyes: Negative.  Negative for visual disturbance.  ?Respiratory:  Negative for chest tightness and shortness of breath.   ?Cardiovascular:  Negative for chest pain, palpitations and leg swelling.  ?Gastrointestinal:  Negative for abdominal pain and nausea.  ?Genitourinary: Negative.   ?Musculoskeletal:  Negative for arthralgias, joint swelling and neck pain.  ?Skin: Negative.  Negative for rash and wound.  ?Neurological:  Positive for headaches. Negative for dizziness, weakness, light-headedness and numbness.  ?Psychiatric/Behavioral: Negative.    ? ?Physical Exam ?Updated Vital Signs ?BP 140/73   Pulse 74   Temp 98.2 ?F (36.8 ?C)   Resp 19   Ht '5\' 5"'$  (1.651 m)   Wt 115.2 kg   SpO2 100%   BMI 42.27 kg/m?  ?Physical Exam ?Vitals and nursing note reviewed.  ?Constitutional:   ?   Appearance: She is well-developed.  ?HENT:  ?   Head: Normocephalic and atraumatic.  ?Eyes:  ?   Conjunctiva/sclera: Conjunctivae normal.  ?Cardiovascular:  ?   Rate and Rhythm: Normal rate and regular rhythm.  ?   Heart sounds: Normal heart sounds.  ?Pulmonary:  ?   Effort: Pulmonary effort is normal.  ?   Breath sounds: Normal breath sounds. No wheezing.  ?Abdominal:  ?   General: Bowel sounds are normal.  ?   Palpations: Abdomen is soft.  ?   Tenderness: There is no abdominal tenderness.  ?Musculoskeletal:     ?   General: Normal range of motion.  ?   Cervical back: Normal range of motion.  ?   Right lower leg: No edema.   ?   Left lower leg: No edema.  ?Skin: ?   General: Skin is warm and dry.  ?Neurological:  ?   Mental Status: She is alert.  ? ? ?ED Results / Procedures / Treatments   ?Labs ?(all labs ordered are listed, but only abnormal results are displayed) ?Labs Reviewed  ?CBC WITH DIFFERENTIAL/PLATELET - Abnormal; Notable for the following components:  ?    Result Value  ? RBC 5.62 (*)   ? HCT 46.9 (*)   ? RDW 15.7 (*)   ? All other components within normal limits  ?URINALYSIS, ROUTINE W REFLEX MICROSCOPIC - Abnormal; Notable for the following components:  ? Color, Urine STRAW (*)   ? Specific Gravity, Urine 1.004 (*)   ? All other components within normal limits  ?BASIC METABOLIC PANEL  ? ? ?EKG ?EKG Interpretation ? ?Date/Time:  Saturday May 15 2021 14:39:16 EDT ?Ventricular  Rate:  81 ?PR Interval:  177 ?QRS Duration: 79 ?QT Interval:  364 ?QTC Calculation: 423 ?R Axis:   76 ?Text Interpretation: Sinus rhythm Low voltage, precordial leads Minimal ST elevation, inferior leads Baseline wander in lead(s) V3 Confirmed by Noemi Chapel [phone]) on 05/15/2021 3:10:59 PM ? ?Radiology ?DG Chest Portable 1 View ? ?Result Date: 05/15/2021 ?CLINICAL DATA:  Hypertension. EXAM: PORTABLE CHEST 1 VIEW COMPARISON:  12/05/2010 FINDINGS: The heart size and mediastinal contours are within normal limits. Both lungs are clear. The visualized skeletal structures are unremarkable. IMPRESSION: No active disease. Electronically Signed   By: Marlaine Hind M.D.   On: 05/15/2021 14:49   ? ?Procedures ?Procedures  ? ? ?Medications Ordered in ED ?Medications  ?acetaminophen (TYLENOL) tablet 650 mg (650 mg Oral Given 05/15/21 1601)  ? ? ?ED Course/ Medical Decision Making/ A&P ?  ?                        ?Medical Decision Making ?Patient with home elevated blood pressures along with left temple headache which has resolved after given a dose of Tylenol here.  She has no sign of end organ damage, EKG, kidney function, chest x-ray is unremarkable.  Patient  denies vision changes, headache has resolved, she has a nonfocal neuro exam, no indication for CT imaging.  She was encouraged to continue her current medications as prescribed but close follow-up with her PCP.

## 2021-06-11 ENCOUNTER — Encounter: Payer: Self-pay | Admitting: *Deleted

## 2021-06-28 ENCOUNTER — Ambulatory Visit (INDEPENDENT_AMBULATORY_CARE_PROVIDER_SITE_OTHER): Payer: 59 | Admitting: Urology

## 2021-06-28 ENCOUNTER — Encounter: Payer: Self-pay | Admitting: Urology

## 2021-06-28 VITALS — BP 141/73 | HR 92 | Ht 66.0 in | Wt 256.0 lb

## 2021-06-28 DIAGNOSIS — M549 Dorsalgia, unspecified: Secondary | ICD-10-CM

## 2021-06-28 DIAGNOSIS — Z87440 Personal history of urinary (tract) infections: Secondary | ICD-10-CM | POA: Diagnosis not present

## 2021-06-28 DIAGNOSIS — N390 Urinary tract infection, site not specified: Secondary | ICD-10-CM

## 2021-06-28 DIAGNOSIS — R8271 Bacteriuria: Secondary | ICD-10-CM | POA: Diagnosis not present

## 2021-06-28 LAB — URINALYSIS, ROUTINE W REFLEX MICROSCOPIC
Bilirubin, UA: NEGATIVE
Glucose, UA: NEGATIVE
Ketones, UA: NEGATIVE
Nitrite, UA: NEGATIVE
Protein,UA: NEGATIVE
RBC, UA: NEGATIVE
Specific Gravity, UA: 1.02 (ref 1.005–1.030)
Urobilinogen, Ur: 0.2 mg/dL (ref 0.2–1.0)
pH, UA: 6 (ref 5.0–7.5)

## 2021-06-28 LAB — MICROSCOPIC EXAMINATION
Bacteria, UA: NONE SEEN
RBC, Urine: NONE SEEN /hpf (ref 0–2)
Renal Epithel, UA: NONE SEEN /hpf

## 2021-06-28 NOTE — Progress Notes (Unsigned)
06/28/2021 2:56 PM   Vanessa George 03-Jul-1970 [id number]  Referring provider: Sharilyn Sites, MD [address] [place],  [place] [address]  No chief complaint on file.   HPI:  New patient - -   1) pyuria - a few months ago she had frequency and back pain. She had back pain again. She had no dysuria or gross hematuria. Urine cx grew "e coli". She took abx and then developed bladder pain. She has been treated with nitrofurantoin and penicillin. She also took cipro and rocephin. She took doxycycline. Her symptoms have cleared. No dysuria. She's never had gross hematuria. She started probiotics. Periods are irregular. She used a "vaginal cream". Increased water intake.   She did cystoscopy with Dr. Exie Parody in the past. CT scan of the abdomen pelvis in 2019 was benign. She had DDD and has back pain.   Today, seen for the above.   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    PMH: Past Medical History:  Diagnosis Date   Acid reflux    Anemia    Asthma    Depression    Diabetes mellitus without complication ([place])    Essential hypertension     Surgical History: Past Surgical History:  Procedure Laterality Date   TUBAL LIGATION      Home Medications:  Allergies as of 06/28/2021       Reactions   Vicodin [hydrocodone-acetaminophen] Other (See Comments)   hallucinations         Medication List        Accurate as of June 28, 2021  2:56 PM. If you have any questions, ask your nurse or doctor.          ALPRAZolam 0.5 MG tablet Commonly known as: XANAX Take 1 tablet by mouth 3 (three) times daily as needed for anxiety.   amLODipine 2.5 MG tablet Commonly known as: NORVASC Take 1 tablet (2.5 mg total) by mouth daily.   amLODipine-benazepril 5-40 MG capsule Commonly known as: LOTREL Take 1 capsule by mouth daily.   amLODipine-olmesartan 5-40 MG tablet Commonly known as: AZOR Take 1 tablet by mouth daily.   amoxicillin 500 MG capsule Commonly known as:  AMOXIL Take 500 mg by mouth 3 (three) times daily.   amoxicillin-clavulanate 875-125 MG tablet Commonly known as: AUGMENTIN Take 1 tablet by mouth every 12 (twelve) hours.   aspirin 81 MG chewable tablet Chew 81 mg by mouth daily.   atorvastatin 10 MG tablet Commonly known as: LIPITOR Take 10 mg by mouth at bedtime.   Breo Ellipta 100-25 MCG/ACT Aepb Generic drug: fluticasone furoate-vilanterol Take 1 puff by mouth daily.   chlorhexidine 0.12 % solution Commonly known as: PERIDEX SMARTSIG:15 Milliliter(s) By Mouth Morning-Evening   ciprofloxacin 500 MG tablet Commonly known as: CIPRO Take 500 mg by mouth 2 (two) times daily.   doxycycline 100 MG capsule Commonly known as: VIBRAMYCIN Take 1 capsule (100 mg total) by mouth 2 (two) times daily.   gentamicin 0.3 % ophthalmic solution Commonly known as: GARAMYCIN SMARTSIG:3 Drop(s) In Eye(s) 4 Times Daily   HYDROcodone-acetaminophen 5-325 MG tablet Commonly known as: NORCO/VICODIN Take 1 tablet by mouth every 4 (four) hours as needed.   ibuprofen 400 MG tablet Commonly known as: ADVIL Take 1 tablet (400 mg total) by mouth every 6 (six) hours as needed.   ibuprofen 800 MG tablet Commonly known as: ADVIL Take 800 mg by mouth 3 (three) times daily as needed.   metFORMIN 500 MG 24 hr tablet  Commonly known as: GLUCOPHAGE-XR Take 500 mg by mouth daily.   multivitamin with minerals Tabs tablet Take 1 tablet by mouth daily.   nitrofurantoin (macrocrystal-monohydrate) 100 MG capsule Commonly known as: MACROBID Take 100 mg by mouth every 12 (twelve) hours.   pantoprazole 40 MG tablet Commonly known as: PROTONIX Take 40 mg by mouth daily.   penicillin v potassium 500 MG tablet Commonly known as: VEETID Take 500 mg by mouth 2 (two) times daily.   Ventolin HFA 108 (90 Base) MCG/ACT inhaler Generic drug: albuterol Inhale 2 puffs into the lungs every 4 (four) hours as needed for wheezing or shortness of breath.    vitamin B-12 50 MCG tablet Commonly known as: CYANOCOBALAMIN Take 50 mcg by mouth daily.        Allergies:  Allergies  Allergen Reactions   Vicodin [Hydrocodone-Acetaminophen] Other (See Comments)    hallucinations     Family History: Family History  Problem Relation Age of Onset   AAA (abdominal aortic aneurysm) Mother    Diabetes Mother    Heart attack Father        Age 76    Social History:  reports that she has never smoked. She has never used smokeless tobacco. She reports that she does not drink alcohol and does not use drugs.   Physical Exam: BP (!) 141/73   Pulse 92   Ht '5\' 6"'$  (1.676 m)   Wt 256 lb (116.1 kg)   BMI 41.32 kg/m   Constitutional:  Alert and oriented, No acute distress. HEENT: [place] AT, moist mucus membranes.  Trachea midline, no masses. Cardiovascular: No clubbing, cyanosis, or edema. Respiratory: Normal respiratory effort, no increased work of breathing. GI: Abdomen is soft, nontender, nondistended, no abdominal masses GU: No CVA tenderness Skin: No rashes, bruises or suspicious lesions. Neurologic: Grossly intact, no focal deficits, moving all 4 extremities. Psychiatric: Normal mood and affect.  Laboratory Data: Lab Results  Component Value Date   WBC 9.0 05/15/2021   HGB 14.9 05/15/2021   HCT 46.9 (H) 05/15/2021   MCV 83.5 05/15/2021   PLT 227 05/15/2021    Lab Results  Component Value Date   CREATININE 0.96 05/15/2021    No results found for: PSA  No results found for: TESTOSTERONE  No results found for: HGBA1C  Urinalysis    Component Value Date/Time   COLORURINE STRAW (A) 05/15/2021 1439   APPEARANCEUR CLEAR 05/15/2021 1439   LABSPEC 1.004 (L) 05/15/2021 1439   PHURINE 7.0 05/15/2021 1439   GLUCOSEU NEGATIVE 05/15/2021 1439   HGBUR NEGATIVE 05/15/2021 1439   BILIRUBINUR NEGATIVE 05/15/2021 1439   BILIRUBINUR negative 02/12/2020 1439   KETONESUR NEGATIVE 05/15/2021 1439   PROTEINUR NEGATIVE 05/15/2021 1439    UROBILINOGEN 0.2 02/12/2020 1439   NITRITE NEGATIVE 05/15/2021 1439   LEUKOCYTESUR NEGATIVE 05/15/2021 1439    Lab Results  Component Value Date   BACTERIA NONE SEEN 12/04/2020    Pertinent Imaging: CT 2019 images reviewed  Results for orders placed during the hospital encounter of 10/29/17  CT Renal Stone Study  Narrative CLINICAL DATA:  Right flank pain radiating to back  EXAM: CT ABDOMEN AND PELVIS WITHOUT CONTRAST  TECHNIQUE: Multidetector CT imaging of the abdomen and pelvis was performed following the standard protocol without IV contrast.  COMPARISON:  10/22/2015  FINDINGS: Lower chest: Lung bases are clear.  Hepatobiliary: Unenhanced liver is unremarkable.  Gallbladder is unremarkable. No intrahepatic or extrahepatic ductal dilatation.  Pancreas: Within normal limits.  Spleen: Within normal limits.  Adrenals/Urinary Tract: Adrenal glands within normal limits.  Kidneys are within normal limits. No renal, ureteral, or bladder calculi. No hydronephrosis.  Bladder is within normal limits.  Stomach/Bowel: Stomach is within normal limits.  No evidence of bowel obstruction.  Normal appendix (series 5/image 51).  Vascular/Lymphatic: No evidence of abdominal aortic aneurysm.  No suspicious abdominopelvic lymphadenopathy.  Reproductive: Uterus is grossly unremarkable.  Left ovary is within normal limits.  3.2 cm complex/hyperdense right ovarian lesion versus pedunculated uterine fibroid (series 2/image 32), previously 2.9 cm. Additional dilated tubal cystic lesion in the right adnexa (series 2/image 66), unchanged.  Other: No abdominopelvic ascites.  Musculoskeletal: Mild degenerative changes of the lower thoracic spine.  IMPRESSION: No renal, ureteral, or bladder calculi.  No hydronephrosis.  No evidence of bowel obstruction.  Normal appendix.  3.2 cm complex right ovarian lesion versus pedunculated right adnexal fibroid. Consider pelvic  ultrasound for further evaluation, as clinically warranted.  Suspected chronic right hydrosalpinx.   Electronically Signed By: Julian Hy M.D. On: 10/29/2017 19:06   Assessment & Plan:    1. Recurrent UTI - We discussed she doesn't need abx especially for isolated back pain and bacteriuria which is common perimenopausal. Sometimes abx lead to UTI as they clear out all the colonization. If she develops symptoms again we will check a UA and C&S. We discussed repeat cysto and imaging but again no worrisome symptoms. Drink plenty of water. Continue probiotics.   - Urinalysis, Routine w reflex microscopic   No follow-ups on file.  Festus Aloe, MD  [facility]  [address] [place], [place] [address] [phone]

## 2021-07-08 ENCOUNTER — Other Ambulatory Visit: Payer: 59

## 2021-07-08 ENCOUNTER — Other Ambulatory Visit: Payer: Self-pay | Admitting: Physician Assistant

## 2021-07-08 DIAGNOSIS — N390 Urinary tract infection, site not specified: Secondary | ICD-10-CM

## 2021-07-08 MED ORDER — SULFAMETHOXAZOLE-TRIMETHOPRIM 800-160 MG PO TABS
1.0000 | ORAL_TABLET | Freq: Two times a day (BID) | ORAL | 0 refills | Status: DC
Start: 2021-07-08 — End: 2021-07-12

## 2021-07-08 NOTE — Progress Notes (Signed)
The patient dropped a urine today for evaluation stating that she had urinary burning and symptoms of infection and is going out of town this weekend.  UA = 0-5 WBCs, few bacteria, nitrite negative. Urine is sent for culture and 3 days of Bactrim DS prescribed until culture results available.

## 2021-07-09 LAB — URINALYSIS, ROUTINE W REFLEX MICROSCOPIC
Bilirubin, UA: NEGATIVE
Glucose, UA: NEGATIVE
Ketones, UA: NEGATIVE
Nitrite, UA: NEGATIVE
Protein,UA: NEGATIVE
RBC, UA: NEGATIVE
Specific Gravity, UA: 1.01 (ref 1.005–1.030)
Urobilinogen, Ur: 0.2 mg/dL (ref 0.2–1.0)
pH, UA: 5.5 (ref 5.0–7.5)

## 2021-07-09 LAB — MICROSCOPIC EXAMINATION: RBC, Urine: NONE SEEN /hpf (ref 0–2)

## 2021-07-11 LAB — URINE CULTURE

## 2021-07-12 ENCOUNTER — Telehealth: Payer: Self-pay

## 2021-07-12 MED ORDER — SULFAMETHOXAZOLE-TRIMETHOPRIM 800-160 MG PO TABS: 1.0000 | ORAL_TABLET | Freq: Two times a day (BID) | ORAL | 0 refills | Status: DC

## 2021-07-12 NOTE — Telephone Encounter (Signed)
-----   Message from Audie Box, [place] sent at 07/12/2021  2:55 PM EDT ----- Reviewed with Dr. Junious Silk, culture positive and sensitve to bactrim.

## 2021-07-12 NOTE — Telephone Encounter (Signed)
Patient called for urine culture results- reviewed with Dr. Junious Silk and appropriate was treatment - informed patient and still has slight symptoms per Dr. Junious Silk we will continue 3 more days of medication sent to pharmacy. Patient notified and voiced understanding.

## 2021-07-16 ENCOUNTER — Ambulatory Visit (INDEPENDENT_AMBULATORY_CARE_PROVIDER_SITE_OTHER): Payer: 59 | Admitting: Physician Assistant

## 2021-07-16 ENCOUNTER — Encounter: Payer: Self-pay | Admitting: Physician Assistant

## 2021-07-16 VITALS — BP 150/77 | HR 92 | Ht 66.0 in | Wt 258.0 lb

## 2021-07-16 DIAGNOSIS — R109 Unspecified abdominal pain: Secondary | ICD-10-CM

## 2021-07-16 DIAGNOSIS — N390 Urinary tract infection, site not specified: Secondary | ICD-10-CM | POA: Diagnosis not present

## 2021-07-16 DIAGNOSIS — Z22322 Carrier or suspected carrier of Methicillin resistant Staphylococcus aureus: Secondary | ICD-10-CM

## 2021-07-16 DIAGNOSIS — N3000 Acute cystitis without hematuria: Secondary | ICD-10-CM | POA: Diagnosis not present

## 2021-07-16 LAB — URINALYSIS, ROUTINE W REFLEX MICROSCOPIC
Bilirubin, UA: NEGATIVE
Ketones, UA: NEGATIVE
Nitrite, UA: NEGATIVE
Protein,UA: NEGATIVE
RBC, UA: NEGATIVE
Specific Gravity, UA: <1.005 — ABNORMAL LOW (ref 1.005–1.030)
Urobilinogen, Ur: 0.2 mg/dL (ref 0.2–1.0)
pH, UA: 6 (ref 5.0–7.5)

## 2021-07-16 LAB — MICROSCOPIC EXAMINATION
RBC, Urine: NONE SEEN /hpf (ref 0–2)
Renal Epithel, UA: NONE SEEN /hpf

## 2021-07-16 LAB — BLADDER SCAN AMB NON-IMAGING: Scan Result: 71

## 2021-07-16 MED ORDER — MUPIROCIN 2 % EX OINT: 1.0000 | TOPICAL_OINTMENT | Freq: Two times a day (BID) | CUTANEOUS | 0 refills | Status: DC

## 2021-07-16 MED ORDER — SULFAMETHOXAZOLE-TRIMETHOPRIM 800-160 MG PO TABS: 1.0000 | ORAL_TABLET | Freq: Two times a day (BID) | ORAL | 0 refills | Status: DC

## 2021-07-16 NOTE — Progress Notes (Signed)
post void residual =71mL 

## 2021-07-16 NOTE — Progress Notes (Signed)
Assessment: 1. Recurrent UTI - BLADDER SCAN AMB NON-IMAGING - Urinalysis, Routine w reflex microscopic  2. Acute cystitis without hematuria - Urine Culture  3. Abdominal pain, unspecified abdominal location - CT RENAL STONE STUDY  4. MRSA (methicillin resistant staph aureus) culture positive    Plan: Discussed MRSA at length and patient is given written and verbal instructions concerning the diagnosis.  Culture ordered and will resume Bactrim based on recent cx. Adjust tx if indicated by cx results. Bactroban intranasally x 10 days as discussed.  Differential of her back pain symptoms discussed and because they have been persistent and recurrent, will order a CT stone study to rule out urinary calculus.  Pending discussed results will discuss recommendations for follow-up.  She has previous appointment scheduled with Dr. Junious Silk in 3 months.  Chief Complaint: No chief complaint on file.   HPI: Vanessa George is a 51 y.o. female who presents for continued evaluation of urinary frequency and low back pain. LBP is from flank area on the right radiating to her buttock.  No weakness, numbness tingling, history of injury.  Cx on 6/5 grew 50-100,000 colonies of methicillin-resistant Staph aureus also resistant to Cipro, tetracycline, and intermediate resistance to Levaquin and nitrofurantoin.  She was prescribed Septra which did show good sensitivity.  Urinary symptoms today include persistence of frequency, but no burning, dysuria, incontinence.  Urine = 6-10 WBCs, few bacteria, nitrite negative.  PVR = 71 mL  06/28/21 New patient - -    1) pyuria - a few months ago she had frequency and back pain. She had back pain again. She had no dysuria or gross hematuria. Urine cx grew "e coli". She took abx and then developed bladder pain. She has been treated with nitrofurantoin and penicillin. She also took cipro and rocephin. She took doxycycline. Her symptoms have cleared. No dysuria. She's never had  gross hematuria. She started probiotics. Periods are irregular. She used a "vaginal cream". Increased water intake.    She did cystoscopy with Dr. Exie Parody in the past. CT scan of the abdomen pelvis in 2019 was benign. She had DDD and has back pain.   Portions of the above documentation were copied from a prior visit for review purposes only.  Allergies: Allergies  Allergen Reactions   Vicodin [Hydrocodone-Acetaminophen] Other (See Comments)    hallucinations     PMH: Past Medical History:  Diagnosis Date   Acid reflux    Anemia    Asthma    Depression    Diabetes mellitus without complication ([place])    Essential hypertension     PSH: Past Surgical History:  Procedure Laterality Date   TUBAL LIGATION      SH: Social History   Tobacco Use   Smoking status: Never   Smokeless tobacco: Never  Vaping Use   Vaping Use: Never used  Substance Use Topics   Alcohol use: No   Drug use: No    ROS: All other review of systems were reviewed and are negative except what is noted above in HPI  PE: BP (!) 150/77   Pulse 92   Ht '5\' 6"'$  (1.676 m)   Wt 258 lb (117 kg)   BMI 41.64 kg/m  GENERAL APPEARANCE:  Well appearing, well developed, well nourished, NAD HEENT:  Atraumatic, normocephalic NECK:  Supple. Trachea midline ABDOMEN:  Soft, non-tender, no masses EXTREMITIES:  Moves all extremities well, without clubbing, cyanosis, or edema NEUROLOGIC:  Alert and oriented x 3, normal gait, CN II-XII grossly  intact MENTAL STATUS:  appropriate BACK: Minimal CVA tenderness on the right, none on the left.  She has mild paraspinous spasm and tenderness over the soft tissues of her lumbar spine as well as over SI joint. SKIN:  Warm, dry, and intact   Results: Laboratory Data: Lab Results  Component Value Date   WBC 9.0 05/15/2021   HGB 14.9 05/15/2021   HCT 46.9 (H) 05/15/2021   MCV 83.5 05/15/2021   PLT 227 05/15/2021    Lab Results  Component Value Date   CREATININE 0.96  05/15/2021    Urinalysis    Component Value Date/Time   COLORURINE STRAW (A) 05/15/2021 1439   APPEARANCEUR Clear 07/16/2021 1130   LABSPEC 1.004 (L) 05/15/2021 1439   PHURINE 7.0 05/15/2021 1439   GLUCOSEU 1+ (A) 07/16/2021 1130   HGBUR NEGATIVE 05/15/2021 1439   BILIRUBINUR Negative 07/16/2021 1130   KETONESUR NEGATIVE 05/15/2021 1439   PROTEINUR Negative 07/16/2021 1130   PROTEINUR NEGATIVE 05/15/2021 1439   UROBILINOGEN 0.2 02/12/2020 1439   NITRITE Negative 07/16/2021 1130   NITRITE NEGATIVE 05/15/2021 1439   LEUKOCYTESUR Trace (A) 07/16/2021 1130   LEUKOCYTESUR NEGATIVE 05/15/2021 1439    Lab Results  Component Value Date   LABMICR See below: 07/16/2021   WBCUA 6-10 (A) 07/16/2021   LABEPIT 0-10 07/16/2021   MUCUS Present 07/16/2021   BACTERIA Few 07/16/2021    Pertinent Imaging: No results found for this or any previous visit.  No results found for this or any previous visit.  No results found for this or any previous visit.  No results found for this or any previous visit.  No results found for this or any previous visit.  No results found for this or any previous visit.  No results found for this or any previous visit.  Results for orders placed during the hospital encounter of 10/29/17  CT Renal Stone Study  Narrative CLINICAL DATA:  Right flank pain radiating to back  EXAM: CT ABDOMEN AND PELVIS WITHOUT CONTRAST  TECHNIQUE: Multidetector CT imaging of the abdomen and pelvis was performed following the standard protocol without IV contrast.  COMPARISON:  10/22/2015  FINDINGS: Lower chest: Lung bases are clear.  Hepatobiliary: Unenhanced liver is unremarkable.  Gallbladder is unremarkable. No intrahepatic or extrahepatic ductal dilatation.  Pancreas: Within normal limits.  Spleen: Within normal limits.  Adrenals/Urinary Tract: Adrenal glands within normal limits.  Kidneys are within normal limits. No renal, ureteral, or  bladder calculi. No hydronephrosis.  Bladder is within normal limits.  Stomach/Bowel: Stomach is within normal limits.  No evidence of bowel obstruction.  Normal appendix (series 5/image 51).  Vascular/Lymphatic: No evidence of abdominal aortic aneurysm.  No suspicious abdominopelvic lymphadenopathy.  Reproductive: Uterus is grossly unremarkable.  Left ovary is within normal limits.  3.2 cm complex/hyperdense right ovarian lesion versus pedunculated uterine fibroid (series 2/image 32), previously 2.9 cm. Additional dilated tubal cystic lesion in the right adnexa (series 2/image 66), unchanged.  Other: No abdominopelvic ascites.  Musculoskeletal: Mild degenerative changes of the lower thoracic spine.  IMPRESSION: No renal, ureteral, or bladder calculi.  No hydronephrosis.  No evidence of bowel obstruction.  Normal appendix.  3.2 cm complex right ovarian lesion versus pedunculated right adnexal fibroid. Consider pelvic ultrasound for further evaluation, as clinically warranted.  Suspected chronic right hydrosalpinx.   Electronically Signed By: Julian Hy M.D. On: 10/29/2017 19:06  No results found for this or any previous visit (from the past 24 hour(s)).

## 2021-07-18 LAB — URINE CULTURE

## 2021-07-19 ENCOUNTER — Ambulatory Visit (HOSPITAL_COMMUNITY): Payer: 59

## 2021-07-20 ENCOUNTER — Telehealth: Payer: Self-pay

## 2021-07-20 NOTE — Telephone Encounter (Signed)
Patient called to get her culture results.  Verbal from [place] culture was negative, patient informed.

## 2021-07-21 ENCOUNTER — Ambulatory Visit (HOSPITAL_COMMUNITY)
Admission: RE | Admit: 2021-07-21 | Discharge: 2021-07-21 | Disposition: A | Discharge status: Home / Self Care | Payer: 59 | Source: Ambulatory Visit | Attending: Physician Assistant | Admitting: Physician Assistant

## 2021-07-21 DIAGNOSIS — R109 Unspecified abdominal pain: Secondary | ICD-10-CM | POA: Diagnosis present

## 2021-07-22 ENCOUNTER — Telehealth: Payer: Self-pay

## 2021-07-22 ENCOUNTER — Telehealth: Payer: Self-pay | Admitting: Physician Assistant

## 2021-07-22 ENCOUNTER — Other Ambulatory Visit: Payer: Self-pay | Admitting: Physician Assistant

## 2021-07-22 ENCOUNTER — Ambulatory Visit (HOSPITAL_COMMUNITY)
Admission: RE | Admit: 2021-07-22 | Discharge: 2021-07-22 | Disposition: A | Discharge status: Home / Self Care | Payer: 59 | Source: Ambulatory Visit | Attending: Physician Assistant | Admitting: Physician Assistant

## 2021-07-22 DIAGNOSIS — N83202 Unspecified ovarian cyst, left side: Secondary | ICD-10-CM | POA: Insufficient documentation

## 2021-07-22 DIAGNOSIS — D259 Leiomyoma of uterus, unspecified: Secondary | ICD-10-CM | POA: Insufficient documentation

## 2021-07-22 DIAGNOSIS — N83201 Unspecified ovarian cyst, right side: Secondary | ICD-10-CM | POA: Insufficient documentation

## 2021-07-22 NOTE — Telephone Encounter (Signed)
PT CT indicates need for pelvic US. Attempted to call pt and the voicemail number does not match the number dialed.  We will attempt to call her at work later this morning.

## 2021-07-22 NOTE — Telephone Encounter (Signed)
-----   Message from [place], [place] sent at 07/22/2021  8:34 AM EDT ----- Please let pt know her CT shows an ovarian cyst that looks benign, but due to her low back pain an ultrasound is needed to confirm that blood flow is adequate to the ovaries. I have ordered and [place] and she will need follow up with her OBGYN after the study. ----- Message ----- From: Interface, Rad Results In Sent: 07/21/2021  11:26 PM EDT To: Reynaldo Minium, PA-C

## 2021-07-22 NOTE — Progress Notes (Signed)
Pt c/o R sided LBP and ovarian cysts seen on CT. Recommend [place] to r/o torsion or arterial flow obstruction

## 2021-07-28 ENCOUNTER — Telehealth: Payer: Self-pay

## 2021-07-28 NOTE — Telephone Encounter (Signed)
Patient wanted to know if you could call her regarding the U/S and the message in my chart.

## 2021-07-29 NOTE — Telephone Encounter (Signed)
Patient is asking if she can have a referral to GYN, [facility] if possible?  She is asking about how many cysts she has and how big they are and she just had questions about the details of her u/s.

## 2021-07-30 ENCOUNTER — Other Ambulatory Visit: Payer: Self-pay | Admitting: Physician Assistant

## 2021-07-30 DIAGNOSIS — N83201 Unspecified ovarian cyst, right side: Secondary | ICD-10-CM

## 2021-07-30 NOTE — Telephone Encounter (Signed)
Patient aware and voiced understanding

## 2021-08-15 IMAGING — MG MM DIGITAL SCREENING BILAT W/ TOMO W/ CAD
8 series · 8 of 24 positions shown · non-contrast
Comparison: Previous exam(s).

CLINICAL DATA: Screening.

EXAM:
DIGITAL SCREENING BILATERAL MAMMOGRAM WITH TOMO AND CAD

[R CC synth-2D]
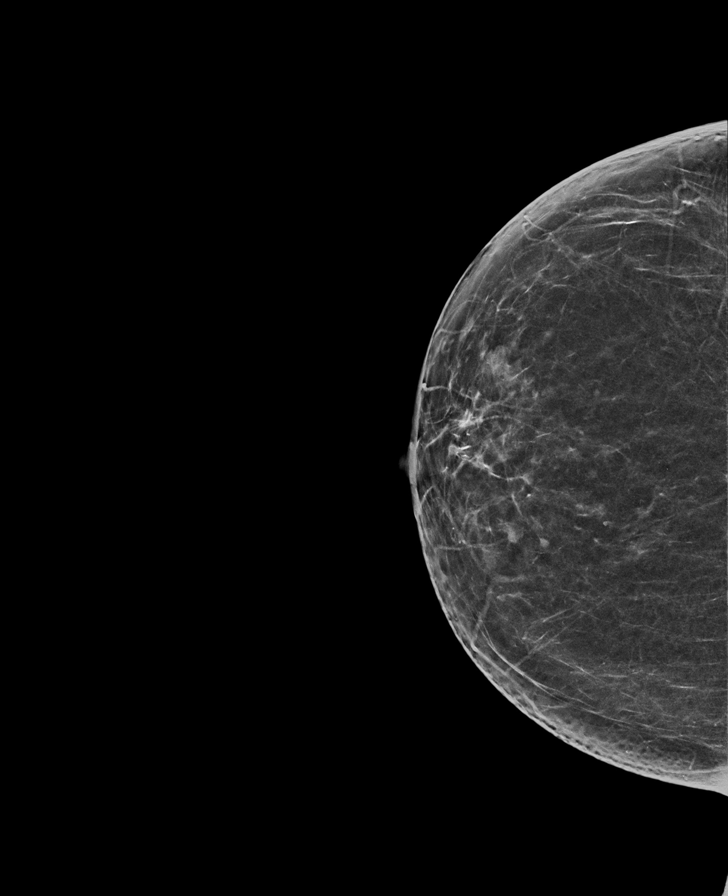

[L CC synth-2D]
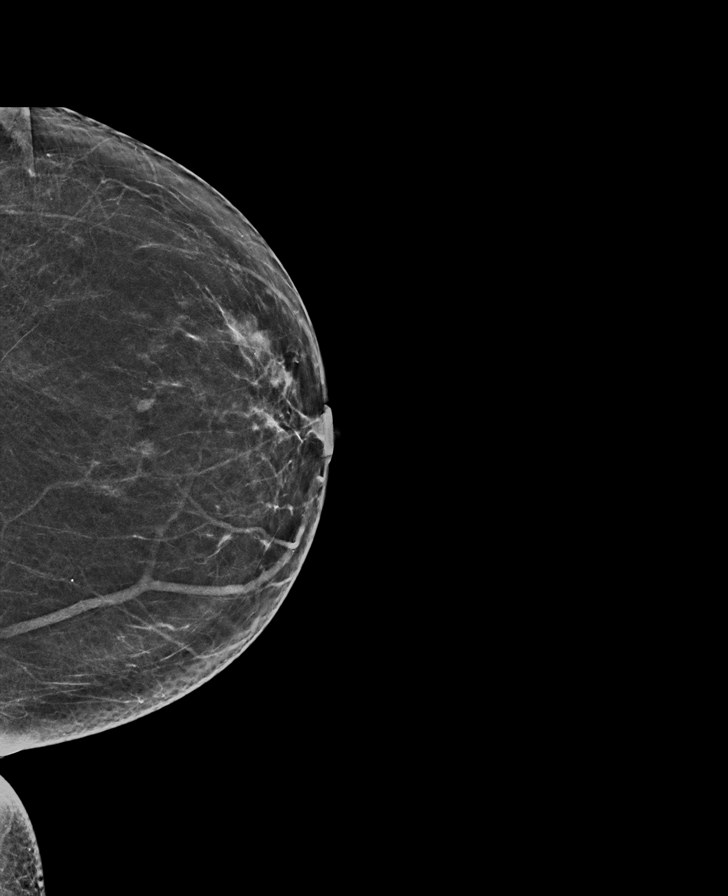

[L MLO synth-2D]
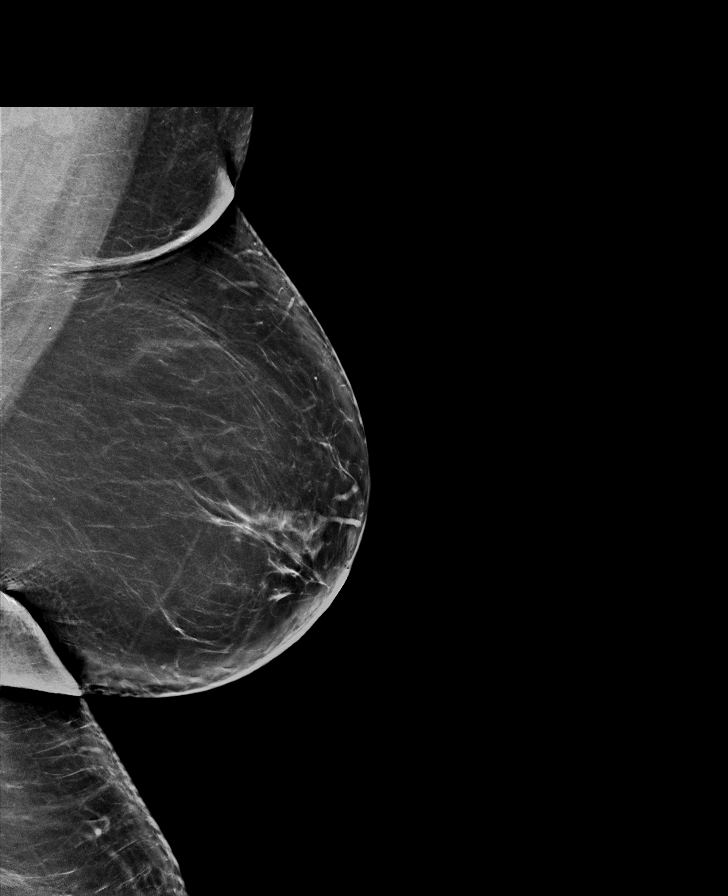

[R MLO synth-2D]
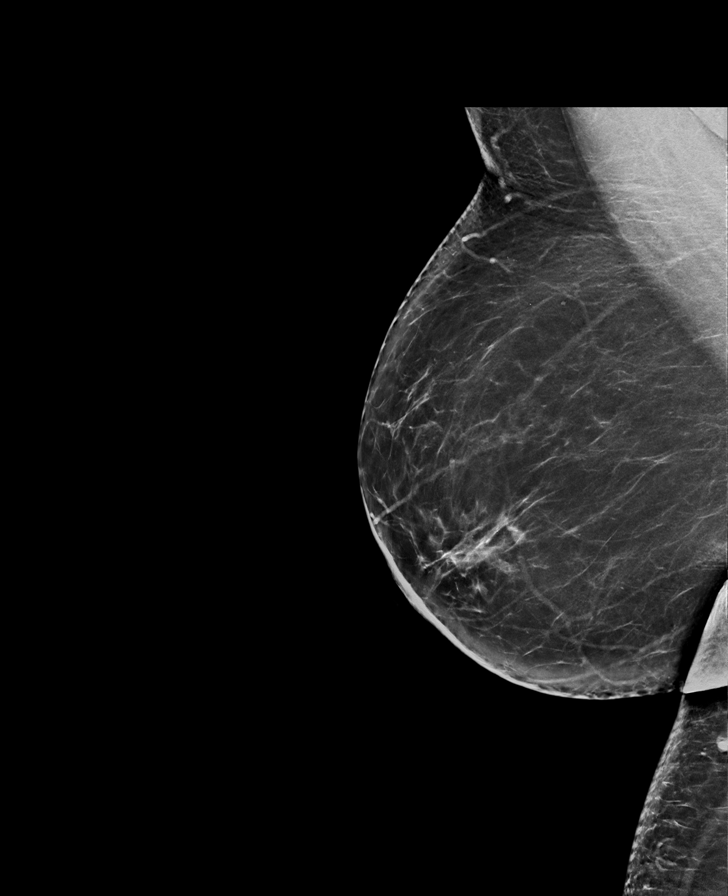

[R MLO tomo · tomo slice 43/85.0]
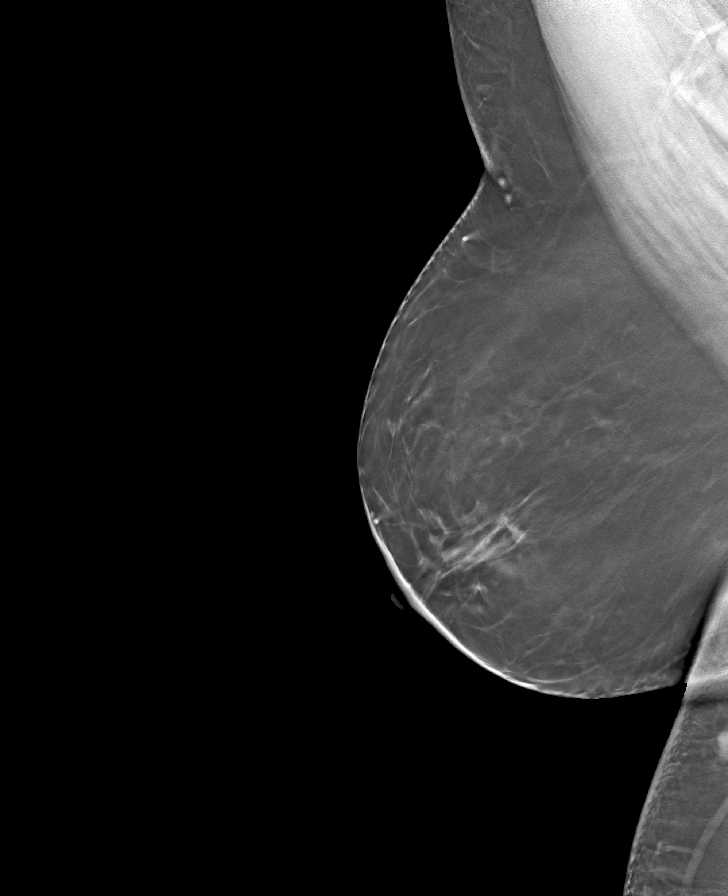

[L CC tomo · tomo slice 31/62.0]
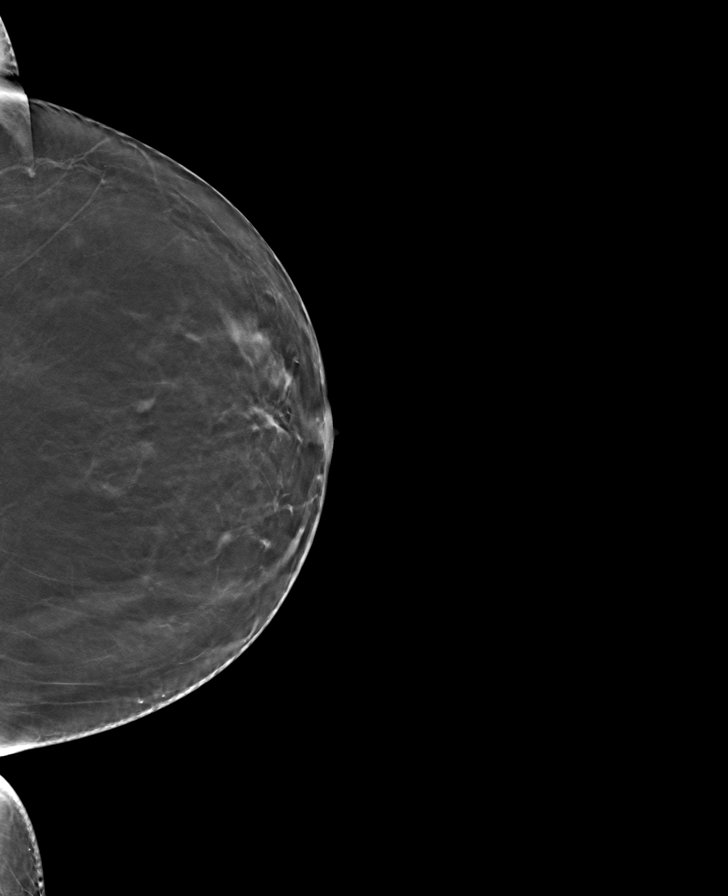

[L MLO tomo · tomo slice 45/90.0]
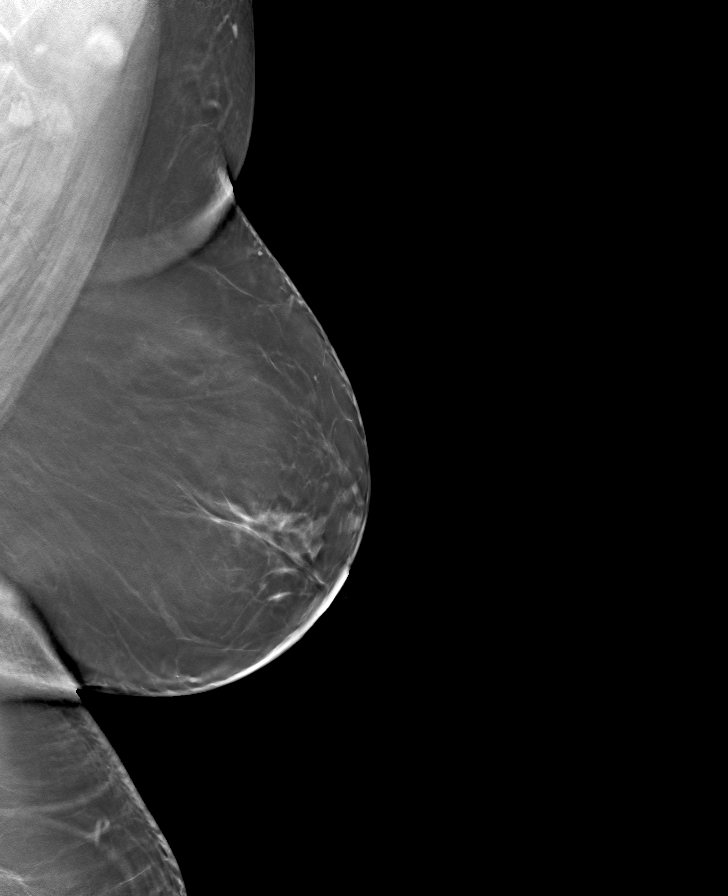

[R CC tomo · tomo slice 33/64.0]
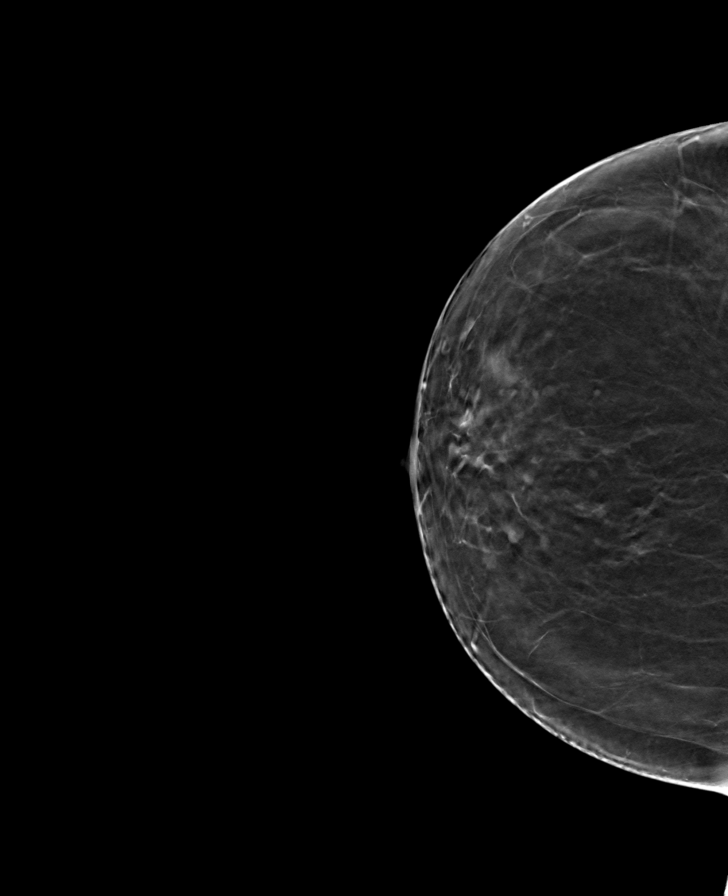

[8 of 24 positions shown; findings below may reference images not displayed]

ACR Breast Density Category b: There are scattered areas of
fibroglandular density.
FINDINGS: There are no findings suspicious for malignancy. Images were
processed with CAD.
IMPRESSION: No mammographic evidence of malignancy. A result letter of this
screening mammogram will be mailed directly to the patient.

RECOMMENDATION:
Screening mammogram in one year. (Code:[id number]-775)

BI-RADS CATEGORY  1: Negative.

## 2021-08-17 ENCOUNTER — Other Ambulatory Visit (HOSPITAL_COMMUNITY)
Admission: RE | Admit: 2021-08-17 | Discharge: 2021-08-17 | Disposition: A | Discharge status: Home / Self Care | Payer: 59 | Source: Ambulatory Visit | Attending: Obstetrics & Gynecology | Admitting: Obstetrics & Gynecology

## 2021-08-17 ENCOUNTER — Ambulatory Visit (INDEPENDENT_AMBULATORY_CARE_PROVIDER_SITE_OTHER): Payer: 59 | Admitting: Obstetrics & Gynecology

## 2021-08-17 ENCOUNTER — Encounter: Payer: Self-pay | Admitting: Obstetrics & Gynecology

## 2021-08-17 VITALS — BP 156/81 | HR 94 | Ht 65.0 in | Wt 263.0 lb

## 2021-08-17 DIAGNOSIS — Z124 Encounter for screening for malignant neoplasm of cervix: Secondary | ICD-10-CM | POA: Diagnosis not present

## 2021-08-17 DIAGNOSIS — D250 Submucous leiomyoma of uterus: Secondary | ICD-10-CM

## 2021-08-17 DIAGNOSIS — N83201 Unspecified ovarian cyst, right side: Secondary | ICD-10-CM | POA: Diagnosis not present

## 2021-08-17 DIAGNOSIS — Z6841 Body Mass Index (BMI) 40.0 and over, adult: Secondary | ICD-10-CM

## 2021-08-17 NOTE — Telephone Encounter (Signed)
error 

## 2021-08-17 NOTE — Progress Notes (Signed)
   GYN VISIT Patient name: Vanessa George MRN [id number]  Date of birth: 1970-07-17 Chief Complaint:   Ovarian Cyst (Right side)  History of Present Illness:   [name] is a 51 y.o. G4P0040 peri-menopausal female being seen today for the following concerns:  Incidental ovarian mass: Initially pt was concerned about a UTI and since her symptoms presented mostly as back pain, imaging was also completed.  Imaging did not show a renal stone; however, it did show an ovarian mass. Of note, she had several back to back UTIs that required multiple rounds of treatment.  When the infection was finally treated, her back pain did resolve.  She denies pelvic or abdominal pain currently.  Denies irregular discharge, itching or irritation.  No acute gyn concern  She is currently peri-menopausal- Last period December 2022.  On occasion will have pink/brown spotting.  07/22/21: Pelvic US- 9.6cm anteverted uterus.  Submucosal leiomyoma 2.8cm.  Exophytic leiomyoma adjacent to uterus and right ovary 5.7x 5.3 x 5.2cm Right ovary 3.1cm cyst with low level internal echoes  No LMP recorded. (Menstrual status: Perimenopausal).     08/17/2021    3:13 PM  Depression screen PHQ 2/9  Decreased Interest 0  Down, Depressed, Hopeless 0  PHQ - 2 Score 0  Altered sleeping 0  Tired, decreased energy 0  Change in appetite 0  Feeling bad or failure about yourself  0  Trouble concentrating 0  Moving slowly or fidgety/restless 0  Suicidal thoughts 0  PHQ-9 Score 0     Review of Systems:   Pertinent items are noted in HPI Denies fever/chills, dizziness, headaches, visual disturbances, fatigue, shortness of breath, chest pain, abdominal pain, vomiting. Pertinent History Reviewed:  Reviewed past medical,surgical, social, obstetrical and family history.  Reviewed problem list, medications and allergies. Physical Assessment:   Vitals:   08/17/21 1515  BP: (!) 156/81  Pulse: 94  Weight: 263 lb (119.3 kg)  Height: 5'  5" (1.651 m)  Body mass index is 43.77 kg/m.       Physical Examination:   General appearance: alert, well appearing, and in no distress  Psych: mood appropriate, normal affect  Skin: warm & dry   Cardiovascular: normal heart rate noted  Respiratory: normal respiratory effort, no distress  Abdomen: obese, soft, non-tender, no rebound, no guarding  Pelvic: VULVA: normal appearing vulva with no masses, tenderness or lesions, VAGINA: normal appearing vagina with normal color and discharge, no lesions, CERVIX: normal appearing cervix without discharge or lesions, UTERUS: uterus is normal size, shape, consistency and nontender, ADNEXA: normal adnexa in size, nontender and no masses Bimanual exam limited due to body habitus  Extremities: no edema   Chaperone: Counselling psychologist    Assessment & Plan:  1) Right ovarian cyst -reviewed US findings, discussed likely benign findings -currently asymptomatic -as per recommendations plan to repeat US in 6-12wks  2) Uterine leiomyomas -discussed that fibroids may cause dysmenorrhea, AUB or other associated finding.  Pt currently asymptomatic, no intervention indicated at this time  3) Preventive screening -pap collected, reviewed ASCCP guidelines  Return for pelvic US in 6-12 weeks, 8yrannual.   JJanyth Pupa DO Attending [employer] FSlingerfor [employer]

## 2021-08-23 LAB — CYTOLOGY - PAP
Comment: NEGATIVE
Diagnosis: NEGATIVE
High risk HPV: NEGATIVE

## 2021-09-29 ENCOUNTER — Telehealth: Payer: Self-pay

## 2021-09-29 NOTE — Telephone Encounter (Signed)
Patient called this morning and advised that she is having UTI symptoms. She is unable to come tomorrow due to another appt and Jill's schedule is full today. I wanted to know if she could drop off a urine sample.

## 2021-09-29 NOTE — Telephone Encounter (Signed)
Scheduled with Sharee Pimple on Friday.

## 2021-10-01 ENCOUNTER — Ambulatory Visit (INDEPENDENT_AMBULATORY_CARE_PROVIDER_SITE_OTHER): Payer: Self-pay | Admitting: Physician Assistant

## 2021-10-01 VITALS — BP 145/84 | HR 94

## 2021-10-01 DIAGNOSIS — R350 Frequency of micturition: Secondary | ICD-10-CM

## 2021-10-01 DIAGNOSIS — Z87440 Personal history of urinary (tract) infections: Secondary | ICD-10-CM

## 2021-10-01 DIAGNOSIS — Z8614 Personal history of Methicillin resistant Staphylococcus aureus infection: Secondary | ICD-10-CM

## 2021-10-01 DIAGNOSIS — N390 Urinary tract infection, site not specified: Secondary | ICD-10-CM

## 2021-10-01 DIAGNOSIS — N342 Other urethritis: Secondary | ICD-10-CM

## 2021-10-01 LAB — URINALYSIS, ROUTINE W REFLEX MICROSCOPIC
Bilirubin, UA: NEGATIVE
Glucose, UA: NEGATIVE
Ketones, UA: NEGATIVE
Leukocytes,UA: NEGATIVE
Nitrite, UA: NEGATIVE
RBC, UA: NEGATIVE
Specific Gravity, UA: 1.025 (ref 1.005–1.030)
Urobilinogen, Ur: 0.2 mg/dL (ref 0.2–1.0)
pH, UA: 5 (ref 5.0–7.5)

## 2021-10-01 MED ORDER — PHENAZOPYRIDINE HCL 100 MG PO TABS: 100.0000 mg | ORAL_TABLET | Freq: Three times a day (TID) | ORAL | 0 refills | Status: DC | PRN

## 2021-10-01 NOTE — Progress Notes (Unsigned)
Assessment: 1. Frequent urination - Urinalysis, Routine w reflex microscopic  2. Recurrent UTI  3. Atrophic urethritis  4. History of methicillin resistant staphylococcus aureus (MRSA)    Plan: Pyridium for urinary burning and advised using lubrication for intercourse. Pt will stop using deodorized soap in genital area. Consider estrace cream twice weekly for urethral atrophy. Pt will discuss with her GYN at FU visit next week. Keep f/u here as scheduled.  Chief Complaint: No chief complaint on file.   HPI: Vanessa George is a 51 y.o. female who presents for continued evaluation of urinary frequency and burning.  Symptoms most often occur during intercourse and just after.  No significant pain, just burning and discomfort.  She has been cleansing the genital area with deodorant soap since diagnosis of MRSA.  She wonders if this has affected her symptoms of burning.  She is postmenopausal and is on no HRT.  No fever, chills, NV. No gross hematuria. Ongoing back pain persists. Urine culture obtained at her most recent visit grew less than 10,000 colonies of mixed urogenital flora.  Since last visit, pt had CT stone study on 07/21/2021 indicating no evidence of urinary stone disease or obstructive uropathy.  Ovarian cysts noted and pt has had FU with GYN for incidental finding of ovarian cyst.  She was also noted to have spinal stenosis with facet disease and disc bulge at L4-5 which would explain some of her back and extremity symptoms.  She completed Bactroban intranasal treatment for previous history of MRSA UTI. UA= clear  Urine cx on 6/23 10K mixed urogenital flora  07/16/21 Vanessa George is a 51 y.o. female who presents for continued evaluation of urinary frequency and low back pain. LBP is from flank area on the right radiating to her buttock.  No weakness, numbness tingling, history of injury.  Cx on 6/5 grew 50-100,000 colonies of methicillin-resistant Staph aureus also resistant to Cipro,  tetracycline, and intermediate resistance to Levaquin and nitrofurantoin.  She was prescribed Septra which did show good sensitivity.  Urinary symptoms today include persistence of frequency, but no burning, dysuria, incontinence.  Urine = 6-10 WBCs, few bacteria, nitrite negative.  PVR = 71 mL   06/28/21 New patient - -    1) pyuria - a few months ago she had frequency and back pain. She had back pain again. She had no dysuria or gross hematuria. Urine cx grew "e coli". She took abx and then developed bladder pain. She has been treated with nitrofurantoin and penicillin. She also took cipro and rocephin. She took doxycycline. Her symptoms have cleared. No dysuria. She's never had gross hematuria. She started probiotics. Periods are irregular. She used a "vaginal cream". Increased water intake.    She did cystoscopy with Dr. Exie Parody in the past. CT scan of the abdomen pelvis in 2019 was benign. She had DDD and has back pain.     Portions of the above documentation were copied from a prior visit for review purposes only.  Allergies: Allergies  Allergen Reactions   Vicodin [Hydrocodone-Acetaminophen] Other (See Comments)    hallucinations     PMH: Past Medical History:  Diagnosis Date   Acid reflux    Anemia    Asthma    Depression    Diabetes mellitus without complication ([place])    Essential hypertension     PSH: Past Surgical History:  Procedure Laterality Date   TUBAL LIGATION      SH: Social History   Tobacco Use  Smoking status: Never   Smokeless tobacco: Never  Vaping Use   Vaping Use: Never used  Substance Use Topics   Alcohol use: No   Drug use: No    ROS: All other review of systems were reviewed and are negative except what is noted above in HPI  PE: BP (!) 145/84   Pulse 94  GENERAL APPEARANCE:  Well appearing, well developed, well nourished, NAD HEENT:  Atraumatic, normocephalic NECK:  Supple. Trachea midline ABDOMEN:  Soft, non-tender, no  masses EXTREMITIES:  Moves all extremities well, without clubbing, cyanosis, or edema NEUROLOGIC:  Alert and oriented x 3, normal gait, CN II-XII grossly intact MENTAL STATUS:  appropriate BACK:  Non-tender to palpation, No CVAT SKIN:  Warm, dry, and intact   Results: Laboratory Data: Lab Results  Component Value Date   WBC 9.0 05/15/2021   HGB 14.9 05/15/2021   HCT 46.9 (H) 05/15/2021   MCV 83.5 05/15/2021   PLT 227 05/15/2021    Lab Results  Component Value Date   CREATININE 0.96 05/15/2021    No results found for: "PSA"  No results found for: "TESTOSTERONE"  No results found for: "HGBA1C"  Urinalysis    Component Value Date/Time   COLORURINE STRAW (A) 05/15/2021 1439   APPEARANCEUR Clear 07/16/2021 1130   LABSPEC 1.004 (L) 05/15/2021 1439   PHURINE 7.0 05/15/2021 1439   GLUCOSEU 1+ (A) 07/16/2021 1130   HGBUR NEGATIVE 05/15/2021 1439   BILIRUBINUR Negative 07/16/2021 1130   KETONESUR NEGATIVE 05/15/2021 1439   PROTEINUR Negative 07/16/2021 1130   PROTEINUR NEGATIVE 05/15/2021 1439   UROBILINOGEN 0.2 02/12/2020 1439   NITRITE Negative 07/16/2021 1130   NITRITE NEGATIVE 05/15/2021 1439   LEUKOCYTESUR Trace (A) 07/16/2021 1130   LEUKOCYTESUR NEGATIVE 05/15/2021 1439    Lab Results  Component Value Date   LABMICR See below: 07/16/2021   WBCUA 6-10 (A) 07/16/2021   LABEPIT 0-10 07/16/2021   MUCUS Present 07/16/2021   BACTERIA Few 07/16/2021    Pertinent Imaging: No results found for this or any previous visit.  No results found for this or any previous visit.  No results found for this or any previous visit.  No results found for this or any previous visit.  No results found for this or any previous visit.  No results found for this or any previous visit.  No results found for this or any previous visit.  Results for orders placed in visit on 07/16/21  CT RENAL STONE STUDY  Narrative CLINICAL DATA:  Flank pain, kidney stone suspected.  Frequent UTIs with multiple antibiotic treatments, right low back pain.  EXAM: CT ABDOMEN AND PELVIS WITHOUT CONTRAST  TECHNIQUE: Multidetector CT imaging of the abdomen and pelvis was performed following the standard protocol without IV contrast.  RADIATION DOSE REDUCTION: This exam was performed according to the departmental dose-optimization program which includes automated exposure control, adjustment of the mA and/or kV according to patient size and/or use of iterative reconstruction technique.  COMPARISON:  Noncontrast abdomen and pelvis CT 10/29/2017.  FINDINGS: Lower chest: No acute abnormality.  Hepatobiliary: 20 cm in length mildly steatotic liver. No focal abnormality is visible without contrast. The gallbladder and bile ducts are unremarkable.  Pancreas: Unremarkable without contrast.  Spleen: Normal in size and unremarkable without contrast.  Adrenals/Urinary Tract: There is no adrenal mass no focal abnormality in the unenhanced renal cortex. There is no evidence of urinary stones or obstruction. The bladder is contracted and not well seen but no intravesical stone is  evident.  Stomach/Bowel: There are small adjacent opacities in the proximal duodenum most likely undigested food products or medication.  The gastric wall is contracted. There is no small bowel obstruction or inflammation.  The appendix is normal. There are colonic diverticula without evidence of focal colitis or diverticulitis.  Vascular/Lymphatic: Mild aortoiliac atherosclerosis. No AAA. No adenopathy is seen.  Reproductive: The uterus is intact. Left ovary is unremarkable. Right ovary demonstrates a 3 cm low-density lesion with a thin wall and contents measuring 30 Hounsfield units. There is no surrounding inflammatory stranding.  Other: There is a small umbilical fat hernia. There is no incarcerated hernia. There is no free air, hemorrhage or fluid. There is no  abscess.  Musculoskeletal: There are degenerative changes of the lower thoracic and lumbar spine, lumbar facet hypertrophy greatest at L4-5 where there is acquired spinal canal stenosis and grade 1 L4-5 degenerative spondylolisthesis. There are no acute or other significant osseous findings.  IMPRESSION: 1. No evidence of urinary stones or obstructive uropathy on the current and prior studies. 2. Bladder contracted and not well seen could possibly be thickened. Correlate clinically with urinalysis. 3. 3 cm low-density right ovarian lesion above the usual density of simple fluid. Statistically this is most likely a hemorrhagic cyst. There is no inflammatory stranding around it. If there are localizing symptoms to the area, ultrasound may be helpful to confirm preservation of blood flow to this ovary. If the patient is postmenopausal, 6-8 weeks ultrasound follow-up is recommended for stability or recharacterization. 4. Mildly prominent liver with steatosis. 5. Aortic atherosclerosis. 6. Umbilical fat hernia. 7. L4-5 with moderate acquired spinal canal stenosis with facet hypertrophy, posterior disc bulge and dorsal ligamentous thickening. 8. Colonic diverticulosis without evidence of diverticulitis.   Electronically Signed By: Telford Nab M.D. On: 07/21/2021 23:24  No results found for this or any previous visit (from the past 24 hour(s)).

## 2021-10-05 ENCOUNTER — Other Ambulatory Visit: Payer: 59

## 2021-10-11 ENCOUNTER — Ambulatory Visit: Payer: 59 | Admitting: Urology

## 2021-10-11 ENCOUNTER — Telehealth: Payer: Self-pay

## 2021-10-11 NOTE — Telephone Encounter (Signed)
Patient called asking if she really need to have a Cysto done due to the fact that the cause of her recurrent UTI was found.  Per J. Summerlin no urgent cause is found to warrant cysto at this time.  Patient advise to schedule f/u with Dr. Junious Silk and if he feels she needs one it can be discussed at that time.  Patient voiced understanding.

## 2021-10-18 ENCOUNTER — Other Ambulatory Visit (HOSPITAL_COMMUNITY): Payer: Self-pay | Admitting: Family Medicine

## 2021-10-18 DIAGNOSIS — Z1231 Encounter for screening mammogram for malignant neoplasm of breast: Secondary | ICD-10-CM

## 2021-10-25 ENCOUNTER — Encounter (HOSPITAL_COMMUNITY): Payer: 59

## 2021-10-25 DIAGNOSIS — Z1231 Encounter for screening mammogram for malignant neoplasm of breast: Secondary | ICD-10-CM

## 2021-11-02 ENCOUNTER — Other Ambulatory Visit: Payer: Self-pay | Admitting: Obstetrics & Gynecology

## 2021-11-02 DIAGNOSIS — D219 Benign neoplasm of connective and other soft tissue, unspecified: Secondary | ICD-10-CM

## 2021-11-02 DIAGNOSIS — N83201 Unspecified ovarian cyst, right side: Secondary | ICD-10-CM

## 2021-11-03 ENCOUNTER — Ambulatory Visit (INDEPENDENT_AMBULATORY_CARE_PROVIDER_SITE_OTHER): Payer: 59

## 2021-11-03 DIAGNOSIS — D219 Benign neoplasm of connective and other soft tissue, unspecified: Secondary | ICD-10-CM

## 2021-11-03 DIAGNOSIS — N83201 Unspecified ovarian cyst, right side: Secondary | ICD-10-CM | POA: Diagnosis not present

## 2021-11-03 NOTE — Progress Notes (Signed)
PELVIC US TA/TV:heterogeneous anteverted uterus,posterior submucosal fibroid distorting the endometrium 5.8 x 5.3 x 5.2 cm,EEC 10.8 mm,normal left ovary,3.5 x 3 x 2.3 cm hypoechoic solid right ovary mass with color flow(limited view) can not rule out exophytic fibroid,small amount of simple adnexal fluid  Chaperone Peggy

## 2021-11-04 ENCOUNTER — Ambulatory Visit (HOSPITAL_COMMUNITY): Payer: Self-pay

## 2021-11-08 ENCOUNTER — Telehealth: Payer: Self-pay | Admitting: Obstetrics & Gynecology

## 2021-11-08 ENCOUNTER — Other Ambulatory Visit: Payer: Self-pay | Admitting: Obstetrics & Gynecology

## 2021-11-08 DIAGNOSIS — N83201 Unspecified ovarian cyst, right side: Secondary | ICD-10-CM

## 2021-11-08 MED ORDER — ALPRAZOLAM 0.5 MG PO TABS: ORAL_TABLET | ORAL | 0 refills | Status: DC

## 2021-11-08 NOTE — Progress Notes (Signed)
Rx placed for MRI as well as premedication

## 2021-11-10 ENCOUNTER — Ambulatory Visit (HOSPITAL_COMMUNITY)
Admission: RE | Admit: 2021-11-10 | Discharge: 2021-11-10 | Disposition: A | Discharge status: Home / Self Care | Payer: 59 | Source: Ambulatory Visit | Attending: Family Medicine | Admitting: Family Medicine

## 2021-11-10 DIAGNOSIS — Z1231 Encounter for screening mammogram for malignant neoplasm of breast: Secondary | ICD-10-CM | POA: Insufficient documentation

## 2021-11-11 DIAGNOSIS — E1165 Type 2 diabetes mellitus with hyperglycemia: Secondary | ICD-10-CM | POA: Diagnosis not present

## 2021-11-11 DIAGNOSIS — E782 Mixed hyperlipidemia: Secondary | ICD-10-CM | POA: Diagnosis not present

## 2021-11-11 DIAGNOSIS — I10 Essential (primary) hypertension: Secondary | ICD-10-CM | POA: Diagnosis not present

## 2021-12-02 ENCOUNTER — Ambulatory Visit (HOSPITAL_COMMUNITY)
Admission: RE | Admit: 2021-12-02 | Discharge: 2021-12-02 | Disposition: A | Discharge status: Home / Self Care | Payer: 59 | Source: Ambulatory Visit | Attending: Obstetrics & Gynecology | Admitting: Obstetrics & Gynecology

## 2021-12-02 DIAGNOSIS — D252 Subserosal leiomyoma of uterus: Secondary | ICD-10-CM | POA: Diagnosis not present

## 2021-12-02 DIAGNOSIS — N83201 Unspecified ovarian cyst, right side: Secondary | ICD-10-CM | POA: Insufficient documentation

## 2021-12-02 DIAGNOSIS — R1909 Other intra-abdominal and pelvic swelling, mass and lump: Secondary | ICD-10-CM | POA: Diagnosis not present

## 2021-12-02 DIAGNOSIS — N839 Noninflammatory disorder of ovary, fallopian tube and broad ligament, unspecified: Secondary | ICD-10-CM | POA: Diagnosis not present

## 2021-12-02 MED ORDER — GADOBUTROL 1 MMOL/ML IV SOLN
10.0000 mL | Freq: Once | INTRAVENOUS | Status: AC | PRN
  Administered 2021-12-02: 10 mL via INTRAVENOUS

## 2022-01-20 ENCOUNTER — Encounter (HOSPITAL_COMMUNITY): Payer: Self-pay

## 2022-01-20 ENCOUNTER — Other Ambulatory Visit: Payer: Self-pay

## 2022-01-20 ENCOUNTER — Emergency Department (HOSPITAL_COMMUNITY)
Admission: EM | Admit: 2022-01-20 | Discharge: 2022-01-20 | Disposition: A | Discharge status: Home / Self Care | Payer: 59 | Attending: Emergency Medicine | Admitting: Emergency Medicine

## 2022-01-20 DIAGNOSIS — Z7982 Long term (current) use of aspirin: Secondary | ICD-10-CM | POA: Diagnosis not present

## 2022-01-20 DIAGNOSIS — E119 Type 2 diabetes mellitus without complications: Secondary | ICD-10-CM | POA: Insufficient documentation

## 2022-01-20 DIAGNOSIS — R9431 Abnormal electrocardiogram [ECG] [EKG]: Secondary | ICD-10-CM | POA: Diagnosis not present

## 2022-01-20 DIAGNOSIS — R202 Paresthesia of skin: Secondary | ICD-10-CM | POA: Diagnosis not present

## 2022-01-20 DIAGNOSIS — Z7984 Long term (current) use of oral hypoglycemic drugs: Secondary | ICD-10-CM | POA: Insufficient documentation

## 2022-01-20 LAB — CBC WITH DIFFERENTIAL/PLATELET
Abs Immature Granulocytes: 0.03 10*3/uL (ref 0.00–0.07)
Basophils Absolute: 0.1 10*3/uL (ref 0.0–0.1)
Basophils Relative: 1 %
Eosinophils Absolute: 0.1 10*3/uL (ref 0.0–0.5)
Eosinophils Relative: 1 %
HCT: 46.4 % — ABNORMAL HIGH (ref 36.0–46.0)
Hemoglobin: 14.5 g/dL (ref 12.0–15.0)
Immature Granulocytes: 0 %
Lymphocytes Relative: 19 %
Lymphs Abs: 1.5 10*3/uL (ref 0.7–4.0)
MCH: 26.4 pg (ref 26.0–34.0)
MCHC: 31.3 g/dL (ref 30.0–36.0)
MCV: 84.5 fL (ref 80.0–100.0)
Monocytes Absolute: 0.5 10*3/uL (ref 0.1–1.0)
Monocytes Relative: 6 %
Neutro Abs: 6 10*3/uL (ref 1.7–7.7)
Neutrophils Relative %: 73 %
Platelets: 230 10*3/uL (ref 150–400)
RBC: 5.49 MIL/uL — ABNORMAL HIGH (ref 3.87–5.11)
RDW: 15.2 % (ref 11.5–15.5)
WBC: 8.1 10*3/uL (ref 4.0–10.5)
nRBC: 0 % (ref 0.0–0.2)

## 2022-01-20 LAB — MAGNESIUM: Magnesium: 2 mg/dL (ref 1.7–2.4)

## 2022-01-20 LAB — BASIC METABOLIC PANEL
Anion gap: 7 (ref 5–15)
BUN: 6 mg/dL (ref 6–20)
CO2: 26 mmol/L (ref 22–32)
Calcium: 8.7 mg/dL — ABNORMAL LOW (ref 8.9–10.3)
Chloride: 105 mmol/L (ref 98–111)
Creatinine, Ser: 0.95 mg/dL (ref 0.44–1.00)
GFR, Estimated: >60 mL/min (ref >60)
Glucose, Bld: 91 mg/dL (ref 70–99)
Potassium: 4.1 mmol/L (ref 3.5–5.1)
Sodium: 138 mmol/L (ref 135–145)

## 2022-01-20 LAB — CBG MONITORING, ED: Glucose-Capillary: 164 mg/dL — ABNORMAL HIGH (ref 70–99)

## 2022-01-20 NOTE — ED Triage Notes (Signed)
Patient states right arm numbness extending to fingertips intermittently for a couple of weeks. Patient states right leg was swollen last night. Patient states at work today her right hand was swollen and fingertips were numb.

## 2022-01-20 NOTE — Discharge Instructions (Addendum)
The tingling in your fingertips might be from carpal tunnel.  Thus you are being referred to an orthopedic specialist.  However you should also follow-up with your primary care physician.  If you develop weakness or numbness to where you cannot feel your hand, or if you notice weakness or numbness anywhere else, have a severe headache, or any other new/concerning symptoms then return to the ER for evaluation.

## 2022-01-20 NOTE — ED Provider Notes (Signed)
[facility] EMERGENCY DEPARTMENT Provider Note   CSN: [id number] Arrival date & time: 01/20/22  1025     History  Chief Complaint  Patient presents with   Hand Numbness    Vanessa George is a 51 y.o. female.  HPI 51 year old female presents for evaluation of right hand numbness and swelling.  She has been dealing with on and off tingling in her right fingertips for the last month or so.  She is a diabetic and was recently started on Trulicity as well over the last month.  The tingling comes and goes, but she most notices it during the night as well as first thing in the morning.  She will feel like she has to shake her hand out.  A couple nights ago she had some right arm pain in the middle of the night but no numbness or weakness.  Yesterday she felt like her lower leg/foot was swollen on the right side but it is not today.  No calf pain.  Today her right hand seemed swollen compared to the left though that was transient this morning and seems to be resolved.  She has a slight headache currently from not eating but otherwise she has not been dealing with a headache, facial symptoms or visual complaints.  Home Medications Prior to Admission medications   Medication Sig Start Date End Date Taking? Authorizing Provider  ALPRAZolam Duanne Moron) 0.5 MG tablet Take one tablet about 20 minutes prior to MRI 11/08/21   Janyth Pupa, DO  amLODipine-olmesartan (AZOR) 5-40 MG tablet Take 1 tablet by mouth daily. 11/10/20   [provider]  aspirin 81 MG chewable tablet Chew 81 mg by mouth daily.     [provider]  atorvastatin (LIPITOR) 10 MG tablet Take 10 mg by mouth at bedtime. 02/01/21   [provider]  BREO ELLIPTA 100-25 MCG/INH AEPB Take 1 puff by mouth daily. Patient not taking: Reported on 10/01/2021 07/17/17   [provider]  ibuprofen (ADVIL) 800 MG tablet Take 800 mg by mouth 3 (three) times daily as needed. Patient not taking: Reported on 10/01/2021 04/20/21    [provider]  metFORMIN (GLUCOPHAGE-XR) 500 MG 24 hr tablet Take 500 mg by mouth daily. 10/09/20   [provider]  Multiple Vitamin (MULTIVITAMIN WITH MINERALS) TABS tablet Take 1 tablet by mouth daily.    [provider]  mupirocin ointment (BACTROBAN) 2 % Place 1 Application into the nose 2 (two) times daily. Patient not taking: Reported on 10/01/2021 07/16/21   Summerlin, Berneice Heinrich, PA-C  pantoprazole (PROTONIX) 40 MG tablet Take 40 mg by mouth daily.  11/09/15   [provider]  phenazopyridine (PYRIDIUM) 100 MG tablet Take 1 tablet (100 mg total) by mouth 3 (three) times daily as needed for pain. 10/01/21   Summerlin, Cristal Ford Annette, PA-C  VENTOLIN HFA 108 (90 BASE) MCG/ACT inhaler Inhale 2 puffs into the lungs every 4 (four) hours as needed for wheezing or shortness of breath. 02/27/13   [provider]  vitamin B-12 (CYANOCOBALAMIN) 50 MCG tablet Take 50 mcg by mouth daily.    [provider]      Allergies    Vicodin [hydrocodone-acetaminophen]    Review of Systems   Review of Systems  Eyes:  Negative for visual disturbance.  Neurological:  Positive for numbness. Negative for weakness.    Physical Exam Updated Vital Signs BP (!) 155/71   Pulse 87   Temp 97.7 F (36.5 C) (Oral)   Resp  18   Ht '5\' 5"'$  (1.651 m)   Wt 115.7 kg   SpO2 100%   BMI 42.43 kg/m  Physical Exam Vitals and nursing note reviewed.  Constitutional:      General: She is not in acute distress.    Appearance: She is well-developed. She is obese. She is not ill-appearing or diaphoretic.  HENT:     Head: Normocephalic and atraumatic.  Cardiovascular:     Rate and Rhythm: Normal rate and regular rhythm.     Pulses:          Radial pulses are 2+ on the right side.     Heart sounds: Normal heart sounds.  Pulmonary:     Effort: Pulmonary effort is normal.     Breath sounds: Normal breath sounds.  Musculoskeletal:     Comments: No appreciable  right hand swelling. Phalen's test does induce the tingling in all of her fingers No calf tenderness or right leg swelling when compared to left  Skin:    General: Skin is warm and dry.  Neurological:     Mental Status: She is alert.     Comments: CN 3-12 grossly intact. 5/5 strength in all 4 extremities. Grossly normal sensation, including over right finger tips. Normal finger to nose.      ED Results / Procedures / Treatments   Labs (all labs ordered are listed, but only abnormal results are displayed) Labs Reviewed  BASIC METABOLIC PANEL - Abnormal; Notable for the following components:      Result Value   Calcium 8.7 (*)    All other components within normal limits  CBC WITH DIFFERENTIAL/PLATELET - Abnormal; Notable for the following components:   RBC 5.49 (*)    HCT 46.4 (*)    All other components within normal limits  CBG MONITORING, ED - Abnormal; Notable for the following components:   Glucose-Capillary 164 (*)    All other components within normal limits  MAGNESIUM    EKG EKG Interpretation  Date/Time:  Thursday January 20 2022 11:03:42 EST Ventricular Rate:  80 PR Interval:  178 QRS Duration: 76 QT Interval:  354 QTC Calculation: 408 R Axis:   98 Text Interpretation: Normal sinus rhythm Rightward axis  no significant change since April 2023 Confirmed by Sherwood Gambler [phone] on 01/20/2022 11:19:18 AM  Radiology No results found.  Procedures Procedures    Medications Ordered in ED Medications - No data to display  ED Course/ Medical Decision Making/ A&P                           Medical Decision Making Amount and/or Complexity of Data Reviewed Labs: ordered.    Details: No significant electrolyte disturbance. No anemia ECG/medicine tests: independent interpretation performed.    Details: No acute ischemia   Patient has multiple different complaints but all of these are transient and no longer present.  Given she does not have any actual leg  swelling or calf pain today, I do not think her transient foot/ankle swelling was related to a DVT.  From a right hand tingling perspective I have very low concern for stroke despite her history of diabetes.  Probably given it seems to be worse at night and positional that it is more related to a peripheral nerve such as carpal tunnel syndrome.  Will give her a wrist brace and have her follow-up with orthopedics but also encouraged her to follow-up with PCP.  Highly doubt stroke or  other CNS emergency.  Stable for discharge home.        Final Clinical Impression(s) / ED Diagnoses Final diagnoses:  Paresthesia    Rx / DC Orders ED Discharge Orders     None         Sherwood Gambler, MD 01/20/22 1507

## 2022-01-22 ENCOUNTER — Ambulatory Visit
Admission: EM | Admit: 2022-01-22 | Discharge: 2022-01-22 | Disposition: A | Discharge status: Home / Self Care | Payer: 59 | Attending: Family Medicine | Admitting: Family Medicine

## 2022-01-22 DIAGNOSIS — N390 Urinary tract infection, site not specified: Secondary | ICD-10-CM | POA: Diagnosis not present

## 2022-01-22 LAB — POCT URINALYSIS DIP (MANUAL ENTRY)
Bilirubin, UA: NEGATIVE
Blood, UA: NEGATIVE
Glucose, UA: NEGATIVE mg/dL
Ketones, POC UA: NEGATIVE mg/dL
Nitrite, UA: NEGATIVE
Spec Grav, UA: 1.015 (ref 1.010–1.025)
Urobilinogen, UA: 0.2 E.U./dL
pH, UA: 7 (ref 5.0–8.0)

## 2022-01-22 MED ORDER — CEPHALEXIN 500 MG PO CAPS: 500.0000 mg | ORAL_CAPSULE | Freq: Two times a day (BID) | ORAL | 0 refills | Status: DC

## 2022-01-22 NOTE — ED Provider Notes (Signed)
[facility]    CSN: [id number] Arrival date & time: 01/22/22  1027      History   Chief Complaint No chief complaint on file.   HPI Vanessa George is a 51 y.o. female.   Presenting today with 1 day history of urinary frequency, urgency, dysuria, right flank pain.  Denies fever, chills, abdominal or pelvic pain, hematuria, nausea, vomiting.  So far not trying anything over-the-counter for symptoms.    Past Medical History:  Diagnosis Date   Acid reflux    Anemia    Asthma    Depression    Diabetes mellitus without complication ([place])    Essential hypertension     There are no problems to display for this patient.   Past Surgical History:  Procedure Laterality Date   TUBAL LIGATION      OB History     Gravida  4   Para      Term      Preterm      AB  4   Living  0      SAB  2   IAB      Ectopic  1   Multiple      Live Births               Home Medications    Prior to Admission medications   Medication Sig Start Date End Date Taking? Authorizing Provider  cephALEXin (KEFLEX) 500 MG capsule Take 1 capsule (500 mg total) by mouth 2 (two) times daily. 01/22/22  Yes Volney American, PA-C  TRULICITY 1.60 FU/9.3AT SOPN SMARTSIG:0.5 Milliliter(s) SUB-Q Once a Week 12/30/21  Yes [provider]  ALPRAZolam Duanne Moron) 0.5 MG tablet Take one tablet about 20 minutes prior to MRI 11/08/21   Janyth Pupa, DO  amLODipine-olmesartan (AZOR) 5-40 MG tablet Take 1 tablet by mouth daily. 11/10/20   [provider]  aspirin 81 MG chewable tablet Chew 81 mg by mouth daily.     [provider]  atorvastatin (LIPITOR) 10 MG tablet Take 10 mg by mouth at bedtime. 02/01/21   [provider]  BREO ELLIPTA 100-25 MCG/INH AEPB Take 1 puff by mouth daily. Patient not taking: Reported on 10/01/2021 07/17/17   [provider]  ibuprofen (ADVIL) 800 MG tablet Take 800 mg by mouth 3 (three) times daily as  needed. Patient not taking: Reported on 10/01/2021 04/20/21   [provider]  metFORMIN (GLUCOPHAGE-XR) 500 MG 24 hr tablet Take 500 mg by mouth daily. 10/09/20   [provider]  Multiple Vitamin (MULTIVITAMIN WITH MINERALS) TABS tablet Take 1 tablet by mouth daily.    [provider]  mupirocin ointment (BACTROBAN) 2 % Place 1 Application into the nose 2 (two) times daily. Patient not taking: Reported on 10/01/2021 07/16/21   Summerlin, Berneice Heinrich, PA-C  pantoprazole (PROTONIX) 40 MG tablet Take 40 mg by mouth daily.  11/09/15   [provider]  phenazopyridine (PYRIDIUM) 100 MG tablet Take 1 tablet (100 mg total) by mouth 3 (three) times daily as needed for pain. 10/01/21   Summerlin, Cristal Ford Annette, PA-C  VENTOLIN HFA 108 (90 BASE) MCG/ACT inhaler Inhale 2 puffs into the lungs every 4 (four) hours as needed for wheezing or shortness of breath. 02/27/13   [provider]  vitamin B-12 (CYANOCOBALAMIN) 50 MCG tablet Take 50 mcg by mouth daily.    [provider]    Family History Family History  Problem Relation Age of Onset  AAA (abdominal aortic aneurysm) Mother    Diabetes Mother    Heart attack Father        Age 104    Social History Social History   Tobacco Use   Smoking status: Never   Smokeless tobacco: Never  Vaping Use   Vaping Use: Never used  Substance Use Topics   Alcohol use: No   Drug use: No     Allergies   Vicodin [hydrocodone-acetaminophen]   Review of Systems Review of Systems Per HPI  Physical Exam Triage Vital Signs ED Triage Vitals  Enc Vitals Group     BP 01/22/22 1318 (!) 143/85     Pulse Rate 01/22/22 1318 73     Resp 01/22/22 1318 20     Temp 01/22/22 1318 97.9 F (36.6 C)     Temp Source 01/22/22 1318 Oral     SpO2 01/22/22 1318 98 %     Weight --      Height --      Head Circumference --      Peak Flow --      Pain Score 01/22/22 1321 5     Pain Loc --      Pain Edu? --       Excl. in [place]? --    No data found.  Updated Vital Signs BP (!) 143/85 (BP Location: Right Arm)   Pulse 73   Temp 97.9 F (36.6 C) (Oral)   Resp 20   SpO2 98%   Visual Acuity Right Eye Distance:   Left Eye Distance:   Bilateral Distance:    Right Eye Near:   Left Eye Near:    Bilateral Near:     Physical Exam Vitals and nursing note reviewed.  Constitutional:      Appearance: Normal appearance. She is not ill-appearing.  HENT:     Head: Atraumatic.  Eyes:     Extraocular Movements: Extraocular movements intact.     Conjunctiva/sclera: Conjunctivae normal.  Cardiovascular:     Rate and Rhythm: Normal rate and regular rhythm.     Heart sounds: Normal heart sounds.  Pulmonary:     Effort: Pulmonary effort is normal.     Breath sounds: Normal breath sounds.  Abdominal:     General: Bowel sounds are normal. There is no distension.     Palpations: Abdomen is soft.     Tenderness: There is no abdominal tenderness. There is no right CVA tenderness, left CVA tenderness or guarding.  Musculoskeletal:        General: Normal range of motion.     Cervical back: Normal range of motion and neck supple.  Skin:    General: Skin is warm and dry.  Neurological:     Mental Status: She is alert and oriented to person, place, and time.  Psychiatric:        Mood and Affect: Mood normal.        Thought Content: Thought content normal.        Judgment: Judgment normal.      UC Treatments / Results  Labs (all labs ordered are listed, but only abnormal results are displayed) Labs Reviewed  POCT URINALYSIS DIP (MANUAL ENTRY) - Abnormal; Notable for the following components:      Result Value   Protein Ur, POC trace (*)    Leukocytes, UA Small (1+) (*)    All other components within normal limits  URINE CULTURE    EKG   Radiology No results found.  Procedures Procedures (including critical care time)  Medications Ordered in UC Medications - No data to display  Initial  Impression / Assessment and Plan / UC Course  I have reviewed the triage vital signs and the nursing notes.  Pertinent labs & imaging results that were available during my care of the patient were reviewed by me and considered in my medical decision making (see chart for details).     Vital signs and exam overall reassuring today, urinalysis with evidence of a possible urinary tract infection.  Urine culture pending, treat with Keflex, fluids in the meantime.  Return for worsening symptoms.  Final Clinical Impressions(s) / UC Diagnoses   Final diagnoses:  Acute lower UTI   Discharge Instructions   None    ED Prescriptions     Medication Sig Dispense Auth. Provider   cephALEXin (KEFLEX) 500 MG capsule Take 1 capsule (500 mg total) by mouth 2 (two) times daily. 10 capsule Volney American, [place]      PDMP not reviewed this encounter.   Volney American, [place] 01/22/22 1351

## 2022-01-22 NOTE — ED Triage Notes (Signed)
Pt reports urgency, low back pain, burning sensation when urinating, low right side abdominal pain x 1 day.

## 2022-01-23 LAB — URINE CULTURE: Culture: <10000 — AB

## 2022-01-25 ENCOUNTER — Telehealth: Payer: Self-pay

## 2022-01-25 NOTE — Telephone Encounter (Signed)
ERROR

## 2022-01-28 ENCOUNTER — Encounter: Payer: Self-pay | Admitting: Orthopedic Surgery

## 2022-01-28 ENCOUNTER — Ambulatory Visit (INDEPENDENT_AMBULATORY_CARE_PROVIDER_SITE_OTHER): Payer: 59 | Admitting: Orthopedic Surgery

## 2022-01-28 VITALS — BP 143/85 | HR 73 | Ht 65.0 in | Wt 258.0 lb

## 2022-01-28 DIAGNOSIS — G5601 Carpal tunnel syndrome, right upper limb: Secondary | ICD-10-CM | POA: Diagnosis not present

## 2022-01-28 NOTE — Patient Instructions (Signed)
Brace at night  Medications as needed  Return if symptoms get worse

## 2022-01-28 NOTE — Progress Notes (Signed)
New Patient Visit  Assessment: Vanessa George is a 52 y.o. RHD female with the following: 1. Carpal tunnel syndrome, right upper limb  Plan: Guida Asman has symptoms consistent with carpal tunnel syndrome.  Overall symptoms are relatively mild.  This is only been going on for a few weeks.  Recommend using the wrist brace at nighttime.  Pay attention to her symptoms.  If symptoms persist, or get worse, we can consider an injection.  She can continue with medications as needed.  Be mindful of positioning of the wrist while at work, using the computer.  She will follow-up as needed.  Follow-up: Return if symptoms worsen or fail to improve.  Subjective:  Chief Complaint  Patient presents with   Hand Pain    Right hand painful for a while but wearing brace helps no pain now.     History of Present Illness: Vanessa George is a 52 y.o. female who presents for evaluation of right hand numbness and tingling.  She states for the past couple of weeks that she was having numbness and tingling to the long and ring fingers of her right hand.  No specific injury.  She also noted some swelling.  This is not happened to her before.  She states her pain and symptoms will get worse at night.  She would have to shake out her hand.  She presented to the emergency department for evaluation.  She was given a brace.  The brace has helped with her symptoms.  She wears this brace all the time.  She works at a [employer] as part of her job.  No prior injury to her wrist.   Review of Systems: No fevers or chills + numbness & tingling No chest pain No shortness of breath No bowel or bladder dysfunction No GI distress No headaches   Medical History:  Past Medical History:  Diagnosis Date   Acid reflux    Anemia    Asthma    Depression    Diabetes mellitus without complication ([place])    Essential hypertension     Past Surgical History:  Procedure Laterality Date   TUBAL LIGATION      Family History  Problem  Relation Age of Onset   AAA (abdominal aortic aneurysm) Mother    Diabetes Mother    Heart attack Father        Age 81   Social History   Tobacco Use   Smoking status: Never   Smokeless tobacco: Never  Vaping Use   Vaping Use: Never used  Substance Use Topics   Alcohol use: No   Drug use: No    Allergies  Allergen Reactions   Vicodin [Hydrocodone-Acetaminophen] Other (See Comments)    hallucinations     Current Meds  Medication Sig   amLODipine-olmesartan (AZOR) 5-40 MG tablet Take 1 tablet by mouth daily.   aspirin 81 MG chewable tablet Chew 81 mg by mouth daily.    atorvastatin (LIPITOR) 10 MG tablet Take 10 mg by mouth at bedtime.   cephALEXin (KEFLEX) 500 MG capsule Take 1 capsule (500 mg total) by mouth 2 (two) times daily.   metFORMIN (GLUCOPHAGE-XR) 500 MG 24 hr tablet Take 500 mg by mouth daily.   Multiple Vitamin (MULTIVITAMIN WITH MINERALS) TABS tablet Take 1 tablet by mouth daily.   mupirocin ointment (BACTROBAN) 2 % Place 1 Application into the nose 2 (two) times daily.   pantoprazole (PROTONIX) 40 MG tablet Take 40 mg by mouth daily.  phenazopyridine (PYRIDIUM) 100 MG tablet Take 1 tablet (100 mg total) by mouth 3 (three) times daily as needed for pain.   TRULICITY 6.00 KH/9.9HF SOPN SMARTSIG:0.5 Milliliter(s) SUB-Q Once a Week   VENTOLIN HFA 108 (90 BASE) MCG/ACT inhaler Inhale 2 puffs into the lungs every 4 (four) hours as needed for wheezing or shortness of breath.   vitamin B-12 (CYANOCOBALAMIN) 50 MCG tablet Take 50 mcg by mouth daily.    Objective: BP (!) 143/85 Comment: on 01/22/22  Pulse 73 Comment: on 01/22/22  Ht '5\' 5"'$  (1.651 m)   Wt 258 lb (117 kg)   BMI 42.93 kg/m   Physical Exam:  General: Alert and oriented. and No acute distress. Gait: Normal gait.  Right wrist and hand without deformity.  No swelling.  Sensation is intact throughout the right hand.  Fingers are warm well-perfused.  Slightly decreased sensation to the long and ring  fingers.  Positive Tinel's.  Positive Phalen's.  Positive Durkan's.  Good strength in right hand.  IMAGING: No new imaging obtained today   New Medications:  No orders of the defined types were placed in this encounter.     Mordecai Rasmussen, MD  01/28/2022 9:27 AM

## 2022-02-14 DIAGNOSIS — G5601 Carpal tunnel syndrome, right upper limb: Secondary | ICD-10-CM | POA: Diagnosis not present

## 2022-02-14 DIAGNOSIS — Z6839 Body mass index (BMI) 39.0-39.9, adult: Secondary | ICD-10-CM | POA: Diagnosis not present

## 2022-02-14 DIAGNOSIS — I10 Essential (primary) hypertension: Secondary | ICD-10-CM | POA: Diagnosis not present

## 2022-02-14 DIAGNOSIS — E1165 Type 2 diabetes mellitus with hyperglycemia: Secondary | ICD-10-CM | POA: Diagnosis not present

## 2022-02-14 DIAGNOSIS — E782 Mixed hyperlipidemia: Secondary | ICD-10-CM | POA: Diagnosis not present

## 2022-02-14 DIAGNOSIS — E6609 Other obesity due to excess calories: Secondary | ICD-10-CM | POA: Diagnosis not present

## 2022-02-14 DIAGNOSIS — N390 Urinary tract infection, site not specified: Secondary | ICD-10-CM | POA: Diagnosis not present

## 2022-02-23 ENCOUNTER — Telehealth: Payer: Self-pay

## 2022-02-23 ENCOUNTER — Ambulatory Visit (INDEPENDENT_AMBULATORY_CARE_PROVIDER_SITE_OTHER): Payer: 59 | Admitting: Urology

## 2022-02-23 DIAGNOSIS — R399 Unspecified symptoms and signs involving the genitourinary system: Secondary | ICD-10-CM | POA: Diagnosis not present

## 2022-02-23 DIAGNOSIS — N390 Urinary tract infection, site not specified: Secondary | ICD-10-CM

## 2022-02-23 LAB — URINALYSIS, ROUTINE W REFLEX MICROSCOPIC
Bilirubin, UA: NEGATIVE
Glucose, UA: NEGATIVE
Ketones, UA: NEGATIVE
Nitrite, UA: NEGATIVE
Specific Gravity, UA: 1.02 (ref 1.005–1.030)
Urobilinogen, Ur: 0.2 mg/dL (ref 0.2–1.0)
pH, UA: 6.5 (ref 5.0–7.5)

## 2022-02-23 LAB — MICROSCOPIC EXAMINATION

## 2022-02-23 MED ORDER — NITROFURANTOIN MONOHYD MACRO 100 MG PO CAPS: 100.0000 mg | ORAL_CAPSULE | Freq: Two times a day (BID) | ORAL | 0 refills | Status: DC

## 2022-02-23 NOTE — Progress Notes (Signed)
Patient presents today for UTI symptoms.  Urine sent for ua and culture.  Verbal from Dr. Alyson Ingles to send in [place] '100mg'$  BID x7 days.  Rx sent and patient notified.

## 2022-02-23 NOTE — Telephone Encounter (Signed)
Made patient aware that her urine was sent for a culture and per Dr. Carlos American '100mg'$  BID X7 days was sent to her pharmacy. Patient voiced understanding

## 2022-02-25 DIAGNOSIS — E6609 Other obesity due to excess calories: Secondary | ICD-10-CM | POA: Diagnosis not present

## 2022-02-25 DIAGNOSIS — E1165 Type 2 diabetes mellitus with hyperglycemia: Secondary | ICD-10-CM | POA: Diagnosis not present

## 2022-02-25 DIAGNOSIS — Z6839 Body mass index (BMI) 39.0-39.9, adult: Secondary | ICD-10-CM | POA: Diagnosis not present

## 2022-02-25 DIAGNOSIS — J069 Acute upper respiratory infection, unspecified: Secondary | ICD-10-CM | POA: Diagnosis not present

## 2022-02-25 LAB — URINE CULTURE

## 2022-03-24 ENCOUNTER — Encounter: Payer: Self-pay | Admitting: Radiology

## 2022-05-18 ENCOUNTER — Ambulatory Visit: Payer: 59 | Admitting: Orthopedic Surgery

## 2022-05-18 ENCOUNTER — Other Ambulatory Visit (INDEPENDENT_AMBULATORY_CARE_PROVIDER_SITE_OTHER): Payer: 59

## 2022-05-18 ENCOUNTER — Encounter: Payer: Self-pay | Admitting: Orthopedic Surgery

## 2022-05-18 VITALS — BP 148/85 | HR 92 | Ht 65.0 in | Wt 264.0 lb

## 2022-05-18 DIAGNOSIS — M25561 Pain in right knee: Secondary | ICD-10-CM

## 2022-05-18 NOTE — Progress Notes (Signed)
Return patient Visit  Assessment: Vanessa George is a 52 y.o. female with the following: 1. Acute pain of right knee  Plan: Vanessa George has pain in her right knee.  This has been ongoing for couple months.  No specific injury.  She did note some swelling recently, but this has improved.  Pain is typically over the anterior aspect the knee.  Severity of the pain waxes and wanes.  No physical exam, there is no laxity.  Minimal tenderness to palpation along the joint lines.  Ibuprofen has been insufficient.  I have recommended a steroid injection, and this was completed in clinic today.  She will follow-up as needed.   Procedure note injection Right knee joint   Verbal consent was obtained to inject the right knee joint  Timeout was completed to confirm the site of injection.  The skin was prepped with alcohol and ethyl chloride was sprayed at the injection site.  A 21-gauge needle was used to inject 40 mg of Depo-Medrol and 1% lidocaine (3 cc) into the right knee using an anterolateral approach.  There were no complications. A sterile bandage was applied.   Follow-up: Return if symptoms worsen or fail to improve.  Subjective:  Chief Complaint  Patient presents with   Knee Pain    R knee pain for a couple months but has recently developed swelling to the lower leg and increased pain.    History of Present Illness: Vanessa George is a 52 y.o. female who presents for evaluation of right knee pain.  She is had pain in the right knee for the past 2 months.  No specific injury.  She does are stepping in a hole several months ago, but nothing more recent.  Pain is intermittent.  She has tried ibuprofen 800 mg, and this has been effective, at times.  However sometimes, the medication has not helped.  She has not tried a brace.  No previous injection.  She has not worked with physical therapy.  She was seen at an urgent care center, and there was some concern for blood clot.  She notes that she has minimal  swelling at this time, and the pain is no longer running down the back of her leg.   Review of Systems: No fevers or chills No numbness or tingling No chest pain No shortness of breath No bowel or bladder dysfunction No GI distress No headaches   Objective: BP (!) 148/85   Pulse 92   Ht  (1.651 m)   Wt 264 lb (119.7 kg)   BMI 43.93 kg/m   Physical Exam:  General: Alert and oriented. and No acute distress. Gait: Right sided antalgic gait.  Evaluation of the right knee demonstrates no swelling.  No bruising is appreciated.  No increased laxity varus valgus stress.  She has good range of motion.  Crepitus with range of motion testing.  She has some tenderness to palpation around the patella.  Negative Lachman.     IMAGING: I personally ordered and reviewed the following images  X-rays of the right knee were obtained in clinic today.  No acute injuries are noted.  Neutral overall alignment.  Mild loss of joint space within the medial compartment.  There are some small osteophytes in the medial, lateral patellofemoral compartments.  No bony lesions.  Impression: Right knee x-rays without acute injury, mild degenerative changes.    New Medications:  No orders of the defined types were placed in this encounter.  Oliver Barre, MD  05/18/2022 2:33 PM

## 2022-05-18 NOTE — Patient Instructions (Addendum)
Instructions Following Joint Injections ° °In clinic today, you received an injection in one of your joints (sometimes more than one).  Occasionally, you can have some pain at the injection site, this is normal.  You can place ice at the injection site, or take over-the-counter medications such as Tylenol (acetaminophen) or Advil (ibuprofen).  Please follow all directions listed on the bottle. ° °If your joint (knee or shoulder) becomes swollen, red or very painful, please contact the clinic for additional assistance.  ° °Two medications were injected, including lidocaine and a steroid (often referred to as cortisone).  Lidocaine is effective almost immediately but wears off quickly.  However, the steroid can take a few days to improve your symptoms.  In some cases, it can make your pain worse for a couple of days.  Do not be concerned if this happens as it is common.  You can apply ice or take some over-the-counter medications as needed.  ° ° ° ° °Knee Exercises ° °Ask your health care provider which exercises are safe for you. Do exercises exactly as told by your health care provider and adjust them as directed. It is normal to feel mild stretching, pulling, tightness, or discomfort as you do these exercises. Stop right away if you feel sudden pain or your pain gets worse. Do not begin these exercises until told by your health care provider. ° °Stretching and range-of-motion exercises °These exercises warm up your muscles and joints and improve the movement and flexibility of your knee. These exercises also help to relieve pain and swelling. ° °Knee extension, prone °Lie on your abdomen (prone position) on a bed. °Place your left / right knee just beyond the edge of the surface so your knee is not on the bed. You can put a towel under your left / right thigh just above your kneecap for comfort. °Relax your leg muscles and allow gravity to straighten your knee (extension). You should feel a stretch behind your left  / right knee. °Hold this position for 10 seconds. °Scoot up so your knee is supported between repetitions. °Repeat 10 times. Complete this exercise 3-4 times per week. °    °Knee flexion, active °Lie on your back with both legs straight. If this causes back discomfort, bend your left / right knee so your foot is flat on the floor. °Slowly slide your left / right heel back toward your buttocks. Stop when you feel a gentle stretch in the front of your knee or thigh (flexion). °Hold this position for 10 seconds. °Slowly slide your left / right heel back to the starting position. °Repeat 10 times. Complete this exercise 3-4 times per week. °  °   °Quadriceps stretch, prone °Lie on your abdomen on a firm surface, such as a bed or padded floor. °Bend your left / right knee and hold your ankle. If you cannot reach your ankle or pant leg, loop a belt around your foot and grab the belt instead. °Gently pull your heel toward your buttocks. Your knee should not slide out to the side. You should feel a stretch in the front of your thigh and knee (quadriceps). °Hold this position for 10 seconds. °Repeat 10 times. Complete this exercise 3-4 times per week. °  °   °Hamstring, supine °Lie on your back (supine position). °Loop a belt or towel over the ball of your left / right foot. The ball of your foot is on the walking surface, right under your toes. °Straighten your left /   right knee and slowly pull on the belt to raise your leg until you feel a gentle stretch behind your knee (hamstring). °Do not let your knee bend while you do this. °Keep your other leg flat on the floor. °Hold this position for 10 seconds. °Repeat 10 times. Complete this exercise 3-4 times per week. ° ° °Strengthening exercises °These exercises build strength and endurance in your knee. Endurance is the ability to use your muscles for a long time, even after they get tired. ° °Quadriceps, isometric °This exercise stretches the muscles in front of your thigh  (quadriceps) without moving your knee joint (isometric). °Lie on your back with your left / right leg extended and your other knee bent. Put a rolled towel or small pillow under your knee if told by your health care provider. °Slowly tense the muscles in the front of your left / right thigh. You should see your kneecap slide up toward your hip or see increased dimpling just above the knee. This motion will push the back of the knee toward the floor. °For 10 seconds, hold the muscle as tight as you can without increasing your pain. °Relax the muscles slowly and completely. °Repeat 10 times. Complete this exercise 3-4 times per week. °.  °   °Straight leg raises °This exercise stretches the muscles in front of your thigh (quadriceps) and the muscles that move your hips (hip flexors). °Lie on your back with your left / right leg extended and your other knee bent. °Tense the muscles in the front of your left / right thigh. You should see your kneecap slide up or see increased dimpling just above the knee. Your thigh may even shake a bit. °Keep these muscles tight as you raise your leg 4-6 inches (10-15 cm) off the floor. Do not let your knee bend. °Hold this position for 10 seconds. °Keep these muscles tense as you lower your leg. °Relax your muscles slowly and completely after each repetition. °Repeat 10 times. Complete this exercise 3-4 times per week. ° °Hamstring, isometric °Lie on your back on a firm surface. °Bend your left / right knee about 30 degrees. °Dig your left / right heel into the surface as if you are trying to pull it toward your buttocks. Tighten the muscles in the back of your thighs (hamstring) to "dig" as hard as you can without increasing any pain. °Hold this position for 10 seconds. °Release the tension gradually and allow your muscles to relax completely for __________ seconds after each repetition. °Repeat 10 times. Complete this exercise 3-4 times per week. ° °Hamstring curls °If told by your  health care provider, do this exercise while wearing ankle weights. Begin with 5 lb weights. Then increase the weight by 1 lb (0.5 kg) increments. You can also use an exercise band °Lie on your abdomen with your legs straight. °Bend your left / right knee as far as you can without feeling pain. Keep your hips flat against the floor. °Hold this position for 10 seconds. °Slowly lower your leg to the starting position. °Repeat 10 times. Complete this exercise 3-4 times per week. °  °   °Squats °This exercise strengthens the muscles in front of your thigh and knee (quadriceps). °Stand in front of a table, with your feet and knees pointing straight ahead. You may rest your hands on the table for balance but not for support. °Slowly bend your knees and lower your hips like you are going to sit in a chair. °Keep   your weight over your heels, not over your toes. °Keep your lower legs upright so they are parallel with the table legs. °Do not let your hips go lower than your knees. °Do not bend lower than told by your health care provider. °If your knee pain increases, do not bend as low. °Hold the squat position for 10 seconds. °Slowly push with your legs to return to standing. Do not use your hands to pull yourself to standing. °Repeat 10 times. Complete this exercise 3-4 times per week °. °    °Wall slides °This exercise strengthens the muscles in front of your thigh and knee (quadriceps). °Lean your back against a smooth wall or door, and walk your feet out 18-24 inches (46-61 cm) from it. °Place your feet hip-width apart. °Slowly slide down the wall or door until your knees bend 90 degrees. Keep your knees over your heels, not over your toes. Keep your knees in line with your hips. °Hold this position for 10 seconds. °Repeat 10 times. Complete this exercise 3-4 times per week. °  °   °Straight leg raises °This exercise strengthens the muscles that rotate the leg at the hip and move it away from your body (hip  abductors). °Lie on your side with your left / right leg in the top position. Lie so your head, shoulder, knee, and hip line up. You may bend your bottom knee to help you keep your balance. °Roll your hips slightly forward so your hips are stacked directly over each other and your left / right knee is facing forward. °Leading with your heel, lift your top leg 4-6 inches (10-15 cm). You should feel the muscles in your outer hip lifting. °Do not let your foot drift forward. °Do not let your knee roll toward the ceiling. °Hold this position for 10 seconds. °Slowly return your leg to the starting position. °Let your muscles relax completely after each repetition. °Repeat 10 times. Complete this exercise 3-4 times per week. °  °   °Straight leg raises °This exercise stretches the muscles that move your hips away from the front of the pelvis (hip extensors). °Lie on your abdomen on a firm surface. You can put a pillow under your hips if that is more comfortable. °Tense the muscles in your buttocks and lift your left / right leg about 4-6 inches (10-15 cm). Keep your knee straight as you lift your leg. °Hold this position for 10 seconds. °Slowly lower your leg to the starting position. °Let your leg relax completely after each repetition. °Repeat 10 times. Complete this exercise 3-4 times per week. ° °

## 2022-05-19 DIAGNOSIS — Z6839 Body mass index (BMI) 39.0-39.9, adult: Secondary | ICD-10-CM | POA: Diagnosis not present

## 2022-05-19 DIAGNOSIS — E782 Mixed hyperlipidemia: Secondary | ICD-10-CM | POA: Diagnosis not present

## 2022-05-19 DIAGNOSIS — E1165 Type 2 diabetes mellitus with hyperglycemia: Secondary | ICD-10-CM | POA: Diagnosis not present

## 2022-05-19 DIAGNOSIS — I10 Essential (primary) hypertension: Secondary | ICD-10-CM | POA: Diagnosis not present

## 2022-05-19 DIAGNOSIS — E7849 Other hyperlipidemia: Secondary | ICD-10-CM | POA: Diagnosis not present

## 2022-05-26 DIAGNOSIS — Z6839 Body mass index (BMI) 39.0-39.9, adult: Secondary | ICD-10-CM | POA: Diagnosis not present

## 2022-05-26 DIAGNOSIS — W57XXXA Bitten or stung by nonvenomous insect and other nonvenomous arthropods, initial encounter: Secondary | ICD-10-CM | POA: Diagnosis not present

## 2022-05-26 DIAGNOSIS — J302 Other seasonal allergic rhinitis: Secondary | ICD-10-CM | POA: Diagnosis not present

## 2022-05-26 DIAGNOSIS — E6609 Other obesity due to excess calories: Secondary | ICD-10-CM | POA: Diagnosis not present

## 2022-06-28 ENCOUNTER — Encounter: Payer: Self-pay | Admitting: Orthopedic Surgery

## 2022-06-28 ENCOUNTER — Ambulatory Visit: Payer: 59 | Admitting: Orthopedic Surgery

## 2022-06-28 VITALS — BP 131/81 | HR 84

## 2022-06-28 DIAGNOSIS — M25561 Pain in right knee: Secondary | ICD-10-CM | POA: Diagnosis not present

## 2022-06-28 MED ORDER — DICLOFENAC SODIUM 50 MG PO TBEC: 50.0000 mg | DELAYED_RELEASE_TABLET | Freq: Two times a day (BID) | ORAL | 0 refills | Status: DC

## 2022-06-28 NOTE — Patient Instructions (Signed)

## 2022-06-28 NOTE — Progress Notes (Signed)
Return patient Visit  Assessment: Vanessa George is a 52 y.o. female with the following: 1. Acute pain of right knee  Plan: Jennfier Mailhot continues to have pain in the right knee.  Prior injection provided relief of her symptoms for about a month.  Since then, she started to experience some pain over the anterior lateral aspect of the right knee.  No swelling on exam today.  Reviewed radiographs again in clinic, which demonstrate some mild degenerative changes, but pretty good overall.  She has good range of motion.  Her knee is stable.  She was fitted for a knee brace, which should provide more support than the brace she previously purchased.  I provided her with a prescription for Voltaren, and she can consider topical treatments, including Voltaren gel, Biofreeze or any over-the-counter topical treatment including patches.  She should consider icing the knee at the end of the day if she is having some pain.  She is heading out of town in a couple of weeks, and if she continues to have difficulty after she returns, I would recommend formal physical therapy.  I also gave her some general knee exercises that she can initiate.  She states her understanding.  She will follow-up as needed.   Follow-up: Return if symptoms worsen or fail to improve.  Subjective:  Chief Complaint  Patient presents with   Knee Pain    R knee pain for 3 mos now states injection only helped for 1 mo. Feels pain in the first few steps after sitting and early morning.     History of Present Illness: Vanessa George is a 52 y.o. female who returns for evaluation of right knee pain.  I saw her in clinic approximately 6 weeks ago.  At that time, I injected her right knee.  She stated helped her symptoms for approximately 1 month.  Since then, she has noted pain.  Just recently, she states that she was walking around for at least 2 hours, while shopping, and started to notice some pain.  She has purchased a brace over-the-counter, and this  is fitting loosely, and may not be providing much support.  She is taking ibuprofen, as much is 800 mg 2-3 times per day.  She is also been taking naproxen some days, 2 pills, but just once per day.  No specific swelling in the knee.  She notes the pain over the anterior and lateral aspect of the knee.   Review of Systems: No fevers or chills No numbness or tingling No chest pain No shortness of breath No bowel or bladder dysfunction No GI distress No headaches   Objective: BP 131/81   Pulse 84   Physical Exam:  General: Alert and oriented. and No acute distress. Gait: Right sided antalgic gait.  Right knee without swelling.  No bruising.  No increased laxity to varus or valgus stress.  She has pretty good range of motion.  No tenderness to palpation of the lateral femoral condyle.  No tenderness to palpation over the lateral tibial plateau.  Mild tenderness to palpation over the anterior lateral aspect of the knee.  Good strength in the lower body.   IMAGING: No new imaging obtained today  X-rays from prior clinic visit demonstrates the following:  X-rays of the right knee were obtained in clinic today.  No acute injuries are noted.  Neutral overall alignment.  Mild loss of joint space within the medial compartment.  There are some small osteophytes in the medial, lateral patellofemoral  compartments.  No bony lesions.  Impression: Right knee x-rays without acute injury, mild degenerative changes.    New Medications:  Meds ordered this encounter  Medications   diclofenac (VOLTAREN) 50 MG EC tablet    Sig: Take 1 tablet (50 mg total) by mouth 2 (two) times daily.    Dispense:  60 tablet    Refill:  0      Oliver Barre, MD  06/28/2022 11:37 AM

## 2022-07-25 ENCOUNTER — Other Ambulatory Visit: Payer: Self-pay | Admitting: Orthopedic Surgery

## 2022-08-12 ENCOUNTER — Other Ambulatory Visit: Payer: Self-pay | Admitting: Orthopedic Surgery

## 2022-08-14 DIAGNOSIS — N390 Urinary tract infection, site not specified: Secondary | ICD-10-CM | POA: Diagnosis not present

## 2022-08-14 DIAGNOSIS — Z6841 Body Mass Index (BMI) 40.0 and over, adult: Secondary | ICD-10-CM | POA: Diagnosis not present

## 2022-08-19 DIAGNOSIS — E7849 Other hyperlipidemia: Secondary | ICD-10-CM | POA: Diagnosis not present

## 2022-08-19 DIAGNOSIS — E782 Mixed hyperlipidemia: Secondary | ICD-10-CM | POA: Diagnosis not present

## 2022-08-19 DIAGNOSIS — E6609 Other obesity due to excess calories: Secondary | ICD-10-CM | POA: Diagnosis not present

## 2022-08-19 DIAGNOSIS — I10 Essential (primary) hypertension: Secondary | ICD-10-CM | POA: Diagnosis not present

## 2022-08-19 DIAGNOSIS — N390 Urinary tract infection, site not specified: Secondary | ICD-10-CM | POA: Diagnosis not present

## 2022-08-19 DIAGNOSIS — D72829 Elevated white blood cell count, unspecified: Secondary | ICD-10-CM | POA: Diagnosis not present

## 2022-08-19 DIAGNOSIS — E1165 Type 2 diabetes mellitus with hyperglycemia: Secondary | ICD-10-CM | POA: Diagnosis not present

## 2022-08-19 DIAGNOSIS — Z6839 Body mass index (BMI) 39.0-39.9, adult: Secondary | ICD-10-CM | POA: Diagnosis not present

## 2022-08-19 DIAGNOSIS — M1711 Unilateral primary osteoarthritis, right knee: Secondary | ICD-10-CM | POA: Diagnosis not present

## 2022-09-20 ENCOUNTER — Other Ambulatory Visit: Payer: Self-pay | Admitting: Orthopedic Surgery

## 2022-09-22 ENCOUNTER — Encounter: Payer: Self-pay | Admitting: Obstetrics & Gynecology

## 2022-09-22 ENCOUNTER — Ambulatory Visit (INDEPENDENT_AMBULATORY_CARE_PROVIDER_SITE_OTHER): Payer: 59 | Admitting: Obstetrics & Gynecology

## 2022-09-22 VITALS — BP 132/79 | HR 93 | Ht 66.0 in | Wt 264.0 lb

## 2022-09-22 DIAGNOSIS — N390 Urinary tract infection, site not specified: Secondary | ICD-10-CM

## 2022-09-22 DIAGNOSIS — Z6841 Body Mass Index (BMI) 40.0 and over, adult: Secondary | ICD-10-CM | POA: Diagnosis not present

## 2022-09-22 DIAGNOSIS — Z01411 Encounter for gynecological examination (general) (routine) with abnormal findings: Secondary | ICD-10-CM | POA: Diagnosis not present

## 2022-09-22 DIAGNOSIS — N83201 Unspecified ovarian cyst, right side: Secondary | ICD-10-CM

## 2022-09-22 NOTE — Progress Notes (Signed)
WELL-WOMAN EXAMINATION Patient name: Vanessa George MRN [id number]  Date of birth: 1970/10/05 Chief Complaint:   Gynecologic Exam  History of Present Illness:   Vanessa George is a 52 y.o. G29P0040 female being seen today for a routine well-woman exam.   Perimenopausal- Notes that she has not gone a full year without a period.  Last month had a little pink spot, but prior to that had not had bleeding since February.    Known right ovarian cyst- prior MRI 11/2021 3cm thecoma- incidental finding.  Today she notes she had one episode of discomfort, but no radiation of pain.  Pain lasted for a few minutes then resolved.  Tried some ibuprofen and noted relief.  Otherwise, this has not been bothersome.  Recurrent UTIs- she reports issues with recurrent UTIs and frequently has to take an antibiotic.  Notes urinary discomfort, frequency and urgency. Reports she can always feel when one has started.  Records reviewed: Ucx 01/2022- mixed flora neg        12/2021- <10,000 UA 09/2021- negative  UCx 06/2021- mixed flora neg  No LMP recorded. (Menstrual status: Perimenopausal).    Last pap 07/2021.  Last mammogram: 10/2021. Last colonoscopy: cologuard- not sure when but recent     09/22/2022    2:13 PM 08/17/2021    3:13 PM  Depression screen PHQ 2/9  Decreased Interest 0 0  Down, Depressed, Hopeless 0 0  PHQ - 2 Score 0 0  Altered sleeping 1 0  Tired, decreased energy 1 0  Change in appetite 0 0  Feeling bad or failure about yourself  0 0  Trouble concentrating 0 0  Moving slowly or fidgety/restless 0 0  Suicidal thoughts 0 0  PHQ-9 Score 2 0      Review of Systems:   Pertinent items are noted in HPI Denies any headaches, blurred vision, fatigue, shortness of breath, chest pain, abdominal pain, bowel movements, urination, or intercourse unless otherwise stated above.  Pertinent History Reviewed:  Reviewed past medical,surgical, social and family history.  Reviewed problem list, medications  and allergies. Physical Assessment:   Vitals:   09/22/22 1414  BP: 132/79  Pulse: 93  Weight: 264 lb (119.7 kg)  Height: 5\' 6"  (1.676 m)  Body mass index is 42.61 kg/m.        Physical Examination:   General appearance - well appearing, and in no distress  Mental status - alert, oriented to person, place, and time  Psych:  She has a normal mood and affect  Skin - warm and dry, normal color, no suspicious lesions noted  Chest - effort normal, all lung fields clear to auscultation bilaterally  Heart - normal rate and regular rhythm  Neck:  midline trachea, no thyromegaly or nodules  Breasts - breasts appear normal, no suspicious masses, no skin or nipple changes or  axillary nodes  Abdomen - obese, soft, nontender, nondistended, no masses or organomegaly  Pelvic - VULVA: normal appearing vulva with no masses, tenderness or lesions  VAGINA: normal appearing vagina with normal color and discharge, no lesions  CERVIX: normal appearing cervix without discharge or lesions, no CMT On bimanual exam: normal sized uterus- no abnormalities appreciated.  No adnexal masses or tenderness noted. Exam limited due to body habitus  Extremities:  No swelling or varicosities noted  Chaperone: Faith Rogue     Assessment & Plan:  1) Well-Woman Exam -pap and mammogram up to date  2) Right ovarian cyst -currently asymptomatic -Will consider repeat US  should she note any change in her symptoms  3) Recurrent UTIs Records reviewed- neither UA or urine culture confirm UTI Suspect Interstitial cystitis/painful bladder syndrome Discussed that definition of recurrent UTI would require at least 3 positive urine cultures with an identified source.  At this time records do not confirm UTI  []  Moving forward have advised patient to call our office to complete a UA and culture anytime she thinks she has an infection  4) Obesity -based on history of right knee issues, urinary concerns and other various  concerns -discussed that weight loss may help to improve her symptoms []  referral created to [facility] in [place]  Meds: No orders of the defined types were placed in this encounter.   Follow-up: Return in about 1 year (around 09/22/2023) for Annual.   Myna Hidalgo, DO Attending Obstetrician & Gynecologist, [facility] for [facility], [facility]

## 2022-10-18 ENCOUNTER — Other Ambulatory Visit: Payer: Self-pay | Admitting: Orthopedic Surgery

## 2022-10-24 ENCOUNTER — Encounter (INDEPENDENT_AMBULATORY_CARE_PROVIDER_SITE_OTHER): Payer: Self-pay | Admitting: Adult Health

## 2022-10-24 ENCOUNTER — Ambulatory Visit (INDEPENDENT_AMBULATORY_CARE_PROVIDER_SITE_OTHER): Payer: Self-pay | Admitting: Adult Health

## 2022-10-24 DIAGNOSIS — E785 Hyperlipidemia, unspecified: Secondary | ICD-10-CM

## 2022-10-24 DIAGNOSIS — Z7985 Long-term (current) use of injectable non-insulin antidiabetic drugs: Secondary | ICD-10-CM | POA: Diagnosis not present

## 2022-10-24 DIAGNOSIS — Z6841 Body Mass Index (BMI) 40.0 and over, adult: Secondary | ICD-10-CM

## 2022-10-24 DIAGNOSIS — Z0289 Encounter for other administrative examinations: Secondary | ICD-10-CM

## 2022-10-24 DIAGNOSIS — Z7984 Long term (current) use of oral hypoglycemic drugs: Secondary | ICD-10-CM

## 2022-10-24 DIAGNOSIS — E1169 Type 2 diabetes mellitus with other specified complication: Secondary | ICD-10-CM | POA: Diagnosis not present

## 2022-10-24 NOTE — Progress Notes (Signed)
Office: [phone]  /  Fax: [phone]   Initial Visit  Vanessa George was seen in clinic today to evaluate for obesity. She is interested in losing weight to improve overall health and reduce the risk of weight related complications. She presents today to review program treatment options, initial physical assessment, and evaluation.     She was referred by: Specialist  When asked what else they would like to accomplish? She states: Adopt healthier eating patterns, Improve energy levels and physical activity, Improve existing medical conditions, Reduce number of medications, Improve quality of life, Improve appearance, and Improve self-confidence  Weight history: She ha been steadily gaining weight since her early 30s  When asked how has your weight affected you? She states: Contributed to medical problems, Contributed to orthopedic problems or mobility issues, Having fatigue, Having poor endurance, Problems with eating patterns, and Has affected mood   Some associated conditions: Hypertension, Diabetes, and Vitamin D Deficiency  Contributing factors: Family history, Nutritional, Medications, Stress, Reduced physical activity, and Eating patterns  Weight promoting medications identified: None  Current nutrition plan: None  Current level of physical activity: Walking  Current or previous pharmacotherapy: OTC "Stackers" ???  Response to medication: Lost weight initially but was unable to sustain weight loss   Past medical history includes:   Past Medical History:  Diagnosis Date   Acid reflux    Anemia    Asthma    Depression    Diabetes mellitus without complication (HCC)    Essential hypertension      Objective:   There were no vitals taken for this visit. She was weighed on the bioimpedance scale: There is no height or weight on file to calculate BMI.  Peak Weight:273 , Body Fat%:41.5, Visceral Fat Rating:13, Weight trend over the last 12 months: Unchanged  General:   Alert, oriented and cooperative. Patient is in no acute distress.  Respiratory: Normal respiratory effort, no problems with respiration noted   Gait: able to ambulate independently  Mental Status: Normal mood and affect. Normal behavior. Normal judgment and thought content.   DIAGNOSTIC DATA REVIEWED:  BMET    Component Value Date/Time   NA 138 01/20/2022 1244   K 4.1 01/20/2022 1244   CL 105 01/20/2022 1244   CO2 26 01/20/2022 1244   GLUCOSE 91 01/20/2022 1244   BUN 6 01/20/2022 1244   CREATININE 0.95 01/20/2022 1244   CALCIUM 8.7 (L) 01/20/2022 1244   GFRNONAA >60 01/20/2022 1244   GFRAA >60 07/23/2019 1426   No results found for: "HGBA1C" No results found for: "INSULIN" CBC    Component Value Date/Time   WBC 8.1 01/20/2022 1244   RBC 5.49 (H) 01/20/2022 1244   HGB 14.5 01/20/2022 1244   HCT 46.4 (H) 01/20/2022 1244   PLT 230 01/20/2022 1244   MCV 84.5 01/20/2022 1244   MCH 26.4 01/20/2022 1244   MCHC 31.3 01/20/2022 1244   RDW 15.2 01/20/2022 1244   Iron/TIBC/Ferritin/ %Sat    Component Value Date/Time   FERRITIN 319 (H) 03/19/2019 0835   Lipid Panel     Component Value Date/Time   TRIG 256 (H) 03/19/2019 0835   Hepatic Function Panel     Component Value Date/Time   PROT 7.7 03/19/2019 0835   ALBUMIN 3.5 03/19/2019 0835   AST 33 03/19/2019 0835   ALT 34 03/19/2019 0835   ALKPHOS 51 03/19/2019 0835   BILITOT 0.5 03/19/2019 0835      Component Value Date/Time   TSH 1.296 04/13/2017 1053  Assessment and Plan:   Morbid obesity (HCC)  Hyperlipidemia associated with type 2 diabetes mellitus (HCC)  ESTABLISH WITH [facility] PROVIDER   Obesity Treatment / Action Plan:  Patient will work on garnering support from family and friends to begin weight loss journey. Will work on eliminating or reducing the presence of highly palatable, calorie dense foods in the home. Will complete provided nutritional and psychosocial assessment questionnaire before the  next appointment. Will be scheduled for indirect calorimetry to determine resting energy expenditure in a fasting state.  This will allow [place] to create a reduced calorie, high-protein meal plan to promote loss of fat mass while preserving muscle mass. Counseled on the health benefits of losing 5%-15% of total body weight. Was counseled on nutritional approaches to weight loss and benefits of reducing processed foods and consuming plant-based foods and high quality protein as part of nutritional weight management. Was counseled on pharmacotherapy and role as an adjunct in weight management.   Obesity Education Performed Today:  She was weighed on the bioimpedance scale and results were discussed and documented in the synopsis.  We discussed obesity as a disease and the importance of a more detailed evaluation of all the factors contributing to the disease.  We discussed the importance of long term lifestyle changes which include nutrition, exercise and behavioral modifications as well as the importance of customizing this to her specific health and social needs.  We discussed the benefits of reaching a healthier weight to alleviate the symptoms of existing conditions and reduce the risks of the biomechanical, metabolic and psychological effects of obesity.  Sagrario Lineberry appears to be in the action stage of change and states they are ready to start intensive lifestyle modifications and behavioral modifications.  30 minutes was spent today on this visit including the above counseling, pre-visit chart review, and post-visit documentation.  Reviewed by clinician on day of visit: allergies, medications, problem list, medical history, surgical history, family history, social history, and previous encounter notes pertinent to obesity diagnosis.   Maurene Hollin d. Gayle Collard, NP-C

## 2022-11-07 ENCOUNTER — Other Ambulatory Visit (HOSPITAL_COMMUNITY): Payer: Self-pay | Admitting: Family Medicine

## 2022-11-07 DIAGNOSIS — Z1231 Encounter for screening mammogram for malignant neoplasm of breast: Secondary | ICD-10-CM

## 2022-11-11 ENCOUNTER — Inpatient Hospital Stay (HOSPITAL_COMMUNITY): Admission: RE | Admit: 2022-11-11 | Payer: 59 | Source: Ambulatory Visit

## 2022-11-11 ENCOUNTER — Ambulatory Visit (HOSPITAL_COMMUNITY)
Admission: RE | Admit: 2022-11-11 | Discharge: 2022-11-11 | Disposition: A | Discharge status: Home / Self Care | Payer: 59 | Source: Ambulatory Visit | Attending: Family Medicine | Admitting: Family Medicine

## 2022-11-11 DIAGNOSIS — Z1231 Encounter for screening mammogram for malignant neoplasm of breast: Secondary | ICD-10-CM | POA: Insufficient documentation

## 2022-11-22 ENCOUNTER — Encounter (INDEPENDENT_AMBULATORY_CARE_PROVIDER_SITE_OTHER): Payer: Self-pay | Admitting: Internal Medicine

## 2022-11-22 ENCOUNTER — Ambulatory Visit (INDEPENDENT_AMBULATORY_CARE_PROVIDER_SITE_OTHER): Payer: 59 | Admitting: Internal Medicine

## 2022-11-22 VITALS — BP 152/84 | HR 79 | Temp 98.0°F | Ht 66.0 in | Wt 257.0 lb

## 2022-11-22 DIAGNOSIS — Z1331 Encounter for screening for depression: Secondary | ICD-10-CM | POA: Diagnosis not present

## 2022-11-22 DIAGNOSIS — I10 Essential (primary) hypertension: Secondary | ICD-10-CM

## 2022-11-22 DIAGNOSIS — K760 Fatty (change of) liver, not elsewhere classified: Secondary | ICD-10-CM

## 2022-11-22 DIAGNOSIS — R0602 Shortness of breath: Secondary | ICD-10-CM

## 2022-11-22 DIAGNOSIS — Z7985 Long-term (current) use of injectable non-insulin antidiabetic drugs: Secondary | ICD-10-CM

## 2022-11-22 DIAGNOSIS — L680 Hirsutism: Secondary | ICD-10-CM

## 2022-11-22 DIAGNOSIS — Z6841 Body Mass Index (BMI) 40.0 and over, adult: Secondary | ICD-10-CM | POA: Diagnosis not present

## 2022-11-22 DIAGNOSIS — E1169 Type 2 diabetes mellitus with other specified complication: Secondary | ICD-10-CM | POA: Diagnosis not present

## 2022-11-22 DIAGNOSIS — R5383 Other fatigue: Secondary | ICD-10-CM

## 2022-11-22 DIAGNOSIS — E785 Hyperlipidemia, unspecified: Secondary | ICD-10-CM

## 2022-11-22 DIAGNOSIS — E119 Type 2 diabetes mellitus without complications: Secondary | ICD-10-CM | POA: Insufficient documentation

## 2022-11-22 HISTORY — DX: Fatty (change of) liver, not elsewhere classified: K76.0

## 2022-11-22 HISTORY — DX: Hirsutism: L68.0

## 2022-11-22 HISTORY — DX: Morbid (severe) obesity due to excess calories: E66.01

## 2022-11-22 NOTE — Assessment & Plan Note (Signed)
I reviewed CMP from July 2024 in Care Everywhere.  Her ALT was 35 AST was 23.  She had a CT scan on June 2023 for renal stones that showed mild hepatic steatosis.  She does not drink alcohol and denies family history of liver disease.  She does not take over-the-counter supplements.  We will complete first tier workup at the next office visit which will likely include hepatitis serologies and iron studies.  We reviewed causes of hepatic steatosis as well as treatment.  She will work on reducing saturated fats, simple and added sugars in her diet.  Losing 15% of body weight will improve condition.  She is also a candidate for incretin therapy.  She will look into the cost of Mounjaro.

## 2022-11-22 NOTE — Assessment & Plan Note (Addendum)
Patient has been experiencing increase in facial hair for about 1 year.  She has not been evaluated for PCOS or elevated androgen state.  I encouraged her to discuss this further with her primary care team otherwise we could initiate workup at a future office visit if not completed.

## 2022-11-22 NOTE — Assessment & Plan Note (Signed)
Patient had been on Trulicity but medication has been discontinued due to cost.  Her most recent A1c was 6.7 a few months ago.  She is currently on metformin 500 mg once a day.  We will check hemoglobin A1c today and vitamin B12 levels.  I feel that patient would benefit from GLP-1 therapy as she is affected by obesity, has MASLD, hypertension and other obesity related comorbid conditions.  She will look at the cost of coverage for [facility] via her insurance.  We will consider initiation of treatment if not cost prohibitive.

## 2022-11-22 NOTE — Assessment & Plan Note (Signed)
I reviewed her most recent lipid profile from July in Care Everywhere.  She had an LDL 65 HDL of 33 with elevated triglycerides.  She is currently on atorvastatin 10 mg a day and denies any adverse effects.  Her lipid derangements is consistent with metabolic syndrome.

## 2022-11-22 NOTE — Progress Notes (Unsigned)
Chief Complaint:   OBESITY Vanessa George (MR# [id number]) is a 52 y.o. female who presents for evaluation and treatment of obesity and related comorbidities. Current BMI is Body mass index is 41.48 kg/m. Vanessa George has been struggling with her weight for many years and has been unsuccessful in either losing weight, maintaining weight loss, or reaching her healthy weight goal.  Vanessa George is currently in the action stage of change and ready to dedicate time achieving and maintaining a healthier weight. Vanessa George is interested in becoming our patient and working on intensive lifestyle modifications including (but not limited to) diet and exercise for weight loss.  Vanessa George's habits were reviewed today and are as follows: Her family eats meals together, she thinks her family will eat healthier with her, her desired weight loss is 77-87 lbs, she started gaining excessive weight when she reached 28, her heaviest weight ever was 270 pounds, she has significant food cravings issues, she snacks frequently in the evenings, she skips meals frequently, she is frequently drinking liquids with calories, she frequently makes poor food choices, and she struggles with emotional eating.  Depression Screen Vanessa George's Food and Mood (modified PHQ-9) score was 8.  Subjective:   1. Other fatigue Vanessa George admits to daytime somnolence and admits to waking up still tired. Patient has a history of symptoms of daytime fatigue. Vanessa George generally gets 5 hours of sleep per night, and states that she has generally restful sleep. Snoring is present. Apneic episodes are not present. Epworth Sleepiness Score is 3.   2. SOB (shortness of breath) on exertion Vanessa George notes increasing shortness of breath with exercising and seems to be worsening over time with weight gain. She notes getting out of breath sooner with activity than she used to. This has not gotten worse recently. Vanessa George denies shortness of breath at rest or orthopnea.  3. Hyperlipidemia  associated with type 2 diabetes mellitus (HCC) I reviewed her most recent lipid profile from July in Care Everywhere.  She had an LDL 65 HDL of 33 with elevated triglycerides.  She is currently on atorvastatin 10 mg a day and denies any adverse effects.  Her lipid derangements is consistent with metabolic syndrome.  4. Primary hypertension Her blood pressure is uncontrolled.  She is currently on amlodipine and olmesartan.  She states this is normal for her.  I counseled her on the risk associated with uncontrolled blood pressure.  We also discussed goals of care, I will like to see her blood pressure closer to 120/80 as she is at increased risk for cardiovascular disease.  5. Type 2 diabetes mellitus with other specified complication, without long-term current use of insulin (HCC) Patient had been on Trulicity but medication has been discontinued due to cost.  Her most recent A1c was 6.7 a few months ago.  She is currently on metformin 500 mg once a day.  We will check hemoglobin A1c today and vitamin B12 levels.  I feel that patient would benefit from GLP-1 therapy as she is affected by obesity, has MASLD, hypertension and other obesity related comorbid conditions.  6. Metabolic dysfunction-associated steatotic liver disease (MASLD) I reviewed CMP from July 2024 in Care Everywhere.  Her ALT was 35 AST was 23.  She had a CT scan on June 2023 for renal stones that showed mild hepatic steatosis.  She does not drink alcohol and denies family history of liver disease.  She does not take over-the-counter supplements.  7. Hirsutism Patient has been experiencing increase in facial  hair for about 1 year.  She has not been evaluated for PCOS or elevated androgen state.  Assessment/Plan:   1. Other fatigue Vanessa George does feel that her weight is causing her energy to be lower than it should be. Fatigue may be related to obesity, depression or many other causes. Labs will be ordered, and in the meanwhile, Vanessa George  will focus on self care including making healthy food choices, increasing physical activity and focusing on stress reduction.  - EKG 12-Lead - Vitamin B12  2. SOB (shortness of breath) on exertion Vanessa George does feel that she gets out of breath more easily that she used to when she exercises. Vanessa George's shortness of breath appears to be obesity related and exercise induced. She has George to work on weight loss and gradually increase exercise to treat her exercise induced shortness of breath. Will continue to monitor closely.  3. Hyperlipidemia associated with type 2 diabetes mellitus (HCC) I reviewed her most recent lipid profile from July in Care Everywhere.  She had an LDL 65 HDL of 33 with elevated triglycerides.  She is currently on atorvastatin 10 mg a day and denies any adverse effects.  Her lipid derangements is consistent with metabolic syndrome.  - Comprehensive metabolic panel  4. Primary hypertension She will begin monitoring blood pressure at home over the next few days checking in the morning and also before bedtime.  If her blood pressures are above target she will reach out to her primary care team for treatment intensification.  5. Type 2 diabetes mellitus with other specified complication, without long-term current use of insulin (HCC) She will look at the cost of coverage for Vanessa George via her insurance.  We will consider initiation of treatment if not cost prohibitive.  - Hemoglobin A1c - Insulin, random  6. Metabolic dysfunction-associated steatotic liver disease (MASLD) We will complete first tier workup at the next office visit which will likely include hepatitis serologies and iron studies.  We reviewed causes of hepatic steatosis as well as treatment.  She will work on reducing saturated fats, simple and added sugars in her diet.  Losing 15% of body weight will improve condition.  She is also a candidate for incretin therapy.  She will look into the cost of Mounjaro.  7.  Hirsutism I encouraged her to discuss this further with her primary care team otherwise we could initiate workup at a future office visit if not completed.  8. Depression screen Yailene had a positive depression screening. Depression is commonly associated with obesity and often results in emotional eating behaviors. We will monitor this closely and work on CBT to help improve the non-hunger eating patterns. Referral to Psychology may be required if no improvement is seen as she continues in our clinic.  9. Morbid obesity (HCC), Starting BMI 41.5 Peak Weight: 270 Starting Weight : 257  Current Weight: 257 Working BMR: 1987 Weight loss goal: 170-180  Nutritional: 1500 Cat 3 Past Pharmacotherapy: Trulicity - 6 months Current Pharmacotherapy: None Contributing factors: Family history, Disruption of circadian rhythm, Nutritional, Reduced physical activity, Eating patterns, and Menopause  - VITAMIN D 25 Hydroxy (Vit-D Deficiency, Fractures)  Vanessa George is currently in the action stage of change and her goal is to continue with weight loss efforts. I recommend Vanessa George begin the structured treatment plan as follows:  She has George to the Category 3 Plan.  Exercise goals: All adults should avoid inactivity. Some physical activity is better than none, and adults who participate in any amount of physical  activity gain some health benefits.   Behavioral modification strategies: increasing lean protein intake, decreasing simple carbohydrates, increasing vegetables, increasing water intake, decreasing liquid calories, increasing high fiber foods, no skipping meals, meal planning and cooking strategies, better snacking choices, and planning for success.  She was informed of the importance of frequent follow-up visits to maximize her success with intensive lifestyle modifications for her multiple health conditions. She was informed we would discuss her lab results at her next visit unless there is a critical  issue that needs to be addressed sooner. Vanessa George to keep her next visit at the [facility] upon time to discuss these results.  Objective:   Blood pressure (!) 152/84, pulse 79, temperature 98 F (36.7 C), height 5\' 6"  (1.676 m), weight 257 lb (116.6 kg), SpO2 98%. Body mass index is 41.48 kg/m.  EKG: Normal sinus rhythm, rate 80 BPM.  Indirect Calorimeter completed today shows a VO2 of 288 and a REE of 1987.  Her calculated basal metabolic rate is 2130 thus her basal metabolic rate is worse than expected.  General: Cooperative, alert, well developed, in no acute distress. HEENT: Conjunctivae and lids unremarkable. Cardiovascular: Regular rhythm.  Lungs: Normal work of breathing. Neurologic: No focal deficits.   Lab Results  Component Value Date   CREATININE 1.03 (H) 11/22/2022   BUN 6 11/22/2022   NA 139 11/22/2022   K 4.9 11/22/2022   CL 99 11/22/2022   CO2 25 11/22/2022   Lab Results  Component Value Date   ALT 33 (H) 11/22/2022   AST 25 11/22/2022   ALKPHOS 76 11/22/2022   BILITOT 0.5 11/22/2022   Lab Results  Component Value Date   HGBA1C 8.4 (H) 11/22/2022   Lab Results  Component Value Date   INSULIN WILL FOLLOW 11/22/2022   Lab Results  Component Value Date   TSH 1.296 04/13/2017   Lab Results  Component Value Date   TRIG 256 (H) 03/19/2019   Lab Results  Component Value Date   WBC 8.1 01/20/2022   HGB 14.5 01/20/2022   HCT 46.4 (H) 01/20/2022   MCV 84.5 01/20/2022   PLT 230 01/20/2022   Lab Results  Component Value Date   FERRITIN 319 (H) 03/19/2019   Attestation Statements:   Reviewed by clinician on day of visit: allergies, medications, problem list, medical history, surgical history, family history, social history, and previous encounter notes.  I have spent 60 minutes in the care of the patient today including: preparing to see patient (e.g. review and interpretation of tests, old notes ), obtaining and/or reviewing separately obtained  history, performing a medically appropriate examination or evaluation, counseling and educating the patient, ordering medications, test or procedures, documenting clinical information in the electronic or other health care record, and independently interpreting results and communicating results to the patient, family, or caregiver.  Trude Mcburney, am acting as transcriptionist for Worthy Rancher, MD.  I have reviewed the above documentation for accuracy and completeness, and I agree with the above. -Worthy Rancher, MD

## 2022-11-22 NOTE — Assessment & Plan Note (Signed)
Peak Weight: 270 Starting Weight : 257  Current Weight: 257 Working BMR: 1987 Weight loss goal: 170-180  Nutritional: 1500 Cat 3 Past Pharmacotherapy: Trulicity - 6 months Current Pharmacotherapy: None Contributing factors: Family history, Disruption of circadian rhythm, Nutritional, Reduced physical activity, Eating patterns, and Menopause

## 2022-11-22 NOTE — Assessment & Plan Note (Signed)
Her blood pressure is uncontrolled.  She is currently on amlodipine and olmesartan.  She states this is normal for her.  I counseled her on the risk associated with uncontrolled blood pressure.  We also discussed goals of care, I will like to see her blood pressure closer to 120/80 as she is at increased risk for cardiovascular disease.  She will begin monitoring blood pressure at home over the next few days checking in the morning and also before bedtime.  If her blood pressures are above target she will reach out to her primary care team for treatment intensification.

## 2022-11-23 LAB — COMPREHENSIVE METABOLIC PANEL
ALT: 33 [IU]/L — ABNORMAL HIGH (ref 0–32)
AST: 25 [IU]/L (ref 0–40)
Albumin: 4 g/dL (ref 3.8–4.9)
Alkaline Phosphatase: 76 [IU]/L (ref 44–121)
BUN/Creatinine Ratio: 6 — ABNORMAL LOW (ref 9–23)
BUN: 6 mg/dL (ref 6–24)
Bilirubin Total: 0.5 mg/dL (ref 0.0–1.2)
CO2: 25 mmol/L (ref 20–29)
Calcium: 9.3 mg/dL (ref 8.7–10.2)
Chloride: 99 mmol/L (ref 96–106)
Creatinine, Ser: 1.03 mg/dL — ABNORMAL HIGH (ref 0.57–1.00)
Globulin, Total: 3 g/dL (ref 1.5–4.5)
Glucose: 148 mg/dL — ABNORMAL HIGH (ref 70–99)
Potassium: 4.9 mmol/L (ref 3.5–5.2)
Sodium: 139 mmol/L (ref 134–144)
Total Protein: 7 g/dL (ref 6.0–8.5)
eGFR: 65 mL/min/{1.73_m2} (ref >59)

## 2022-11-23 LAB — VITAMIN D 25 HYDROXY (VIT D DEFICIENCY, FRACTURES): Vit D, 25-Hydroxy: 27.2 ng/mL — ABNORMAL LOW (ref 30.0–100.0)

## 2022-11-23 LAB — INSULIN, RANDOM: INSULIN: 40.3 u[IU]/mL — ABNORMAL HIGH (ref 2.6–24.9)

## 2022-11-23 LAB — VITAMIN B12: Vitamin B-12: 779 pg/mL (ref 232–1245)

## 2022-11-23 LAB — HEMOGLOBIN A1C
Est. average glucose Bld gHb Est-mCnc: 194 mg/dL
Hgb A1c MFr Bld: 8.4 % — ABNORMAL HIGH (ref 4.8–5.6)

## 2022-12-06 ENCOUNTER — Encounter (INDEPENDENT_AMBULATORY_CARE_PROVIDER_SITE_OTHER): Payer: Self-pay | Admitting: Internal Medicine

## 2022-12-06 ENCOUNTER — Ambulatory Visit (INDEPENDENT_AMBULATORY_CARE_PROVIDER_SITE_OTHER): Payer: 59 | Admitting: Internal Medicine

## 2022-12-06 VITALS — BP 125/77 | HR 76 | Temp 97.5°F | Ht 66.0 in | Wt 257.0 lb

## 2022-12-06 DIAGNOSIS — E1169 Type 2 diabetes mellitus with other specified complication: Secondary | ICD-10-CM

## 2022-12-06 DIAGNOSIS — E785 Hyperlipidemia, unspecified: Secondary | ICD-10-CM | POA: Diagnosis not present

## 2022-12-06 DIAGNOSIS — L680 Hirsutism: Secondary | ICD-10-CM

## 2022-12-06 DIAGNOSIS — E669 Obesity, unspecified: Secondary | ICD-10-CM | POA: Diagnosis not present

## 2022-12-06 DIAGNOSIS — Z6841 Body Mass Index (BMI) 40.0 and over, adult: Secondary | ICD-10-CM | POA: Diagnosis not present

## 2022-12-06 DIAGNOSIS — E559 Vitamin D deficiency, unspecified: Secondary | ICD-10-CM | POA: Diagnosis not present

## 2022-12-06 DIAGNOSIS — Z7984 Long term (current) use of oral hypoglycemic drugs: Secondary | ICD-10-CM

## 2022-12-06 DIAGNOSIS — I10 Essential (primary) hypertension: Secondary | ICD-10-CM | POA: Diagnosis not present

## 2022-12-06 DIAGNOSIS — K760 Fatty (change of) liver, not elsewhere classified: Secondary | ICD-10-CM

## 2022-12-06 MED ORDER — METFORMIN HCL ER 500 MG PO TB24: 500.0000 mg | ORAL_TABLET | Freq: Two times a day (BID) | ORAL | 0 refills | Status: DC

## 2022-12-06 NOTE — Progress Notes (Unsigned)
Office: [phone]  /  Fax: [phone]  Weight Summary And Biometrics  Vitals Temp: (!) 97.5 F (36.4 C) BP: 125/77 Pulse Rate: 76 SpO2: 98 %   Anthropometric Measurements Height: 5\' 6"  (1.676 m) Weight: 257 lb (116.6 kg) BMI (Calculated): 41.5 Weight at Last Visit: 257 lb Weight Lost Since Last Visit: 0 lb Weight Gained Since Last Visit: 0 lb Starting Weight: 257 lb Total Weight Loss (lbs): 0 lb (0 kg) Peak Weight: 264 lb   Body Composition  Body Fat %: 45.1 % Fat Mass (lbs): 116.2 lbs Muscle Mass (lbs): 134.4 lbs Total Body Water (lbs): 100 lbs Visceral Fat Rating : 14    RMR: 1987  Today's Visit #: 2  Starting Date: 11/22/22   Subjective   Chief Complaint: Obesity  Vanessa George is here to discuss her progress with her obesity treatment plan. She is on the the Category 3 Plan and states she is following her eating plan approximately 50 % of the time. She states she is not exercising.  Interval History:   Discussed the use of AI scribe software for clinical note transcription with the patient, who gave verbal consent to proceed.  History of Present Illness   The patient, affected by obesity, type two diabetes, hyperlipidemia, hypertension, and metabolic dysfunction associated steatotic liver disease, presents for a follow-up consultation. The patient reports struggling with high blood sugar levels, despite being on metformin, and has been unable to afford Trulicity for several months due to its high cost. The patient is in the process of changing her insurance plan to potentially cover more effective medications for her conditions, including Mounjaro.  The patient has been experiencing fatigue and constant hunger, which may be attributed to high insulin levels. She has been making efforts to adjust her diet, focusing on high protein and low carbohydrate intake, and aiming for a caloric intake of around 1500 calories per day. However, she reports difficulties  with managing her appetite and understanding her body's hunger signals. She has been trying to meal prep and bring her own lunch to work to have more control over her food intake.  The patient also reports issues with facial hair, which she is interested in addressing. She has not been exercising regularly, which she acknowledges could help improve her insulin resistance and overall health. She expresses a desire to incorporate more nutritious meals into her diet and to gradually make lifestyle changes that will help manage her conditions.     Orexigenic Control:  Reports problems with appetite and hunger signals.  Reports problems with satiety and satiation.  Denies problems with eating patterns and portion control.  Reports abnormal cravings. Denies feeling deprived or restricted.   Barriers identified: cost of medication, strong hunger signals and impaired satiety / inhibitory control, having difficulty focusing on healthy eating, and medical comorbidities.   Pharmacotherapy for weight loss: She is currently taking no anti-obesity medication.  Trulicity has not been shown to be effective for weight management  Assessment and Plan   Treatment Plan For Obesity:  Recommended Dietary Goals  Delayla is currently in the action stage of change. As such, her goal is to continue weight management plan. She has agreed to: continue current plan  Behavioral Intervention  We discussed the following Behavioral Modification Strategies today: increasing lean protein intake to established goals, decreasing simple carbohydrates , increasing vegetables, increasing lower glycemic fruits, increasing fiber rich foods, increasing water intake , work on meal planning and preparation, work on Counselling psychologist calories  using tracking application, and decreasing eating out or consumption of processed foods, and making healthy choices when eating convenient foods.  Additional resources provided today:  Handout on popular protein drinks  and Handout on how to make protein smoothies  Recommended Physical Activity Goals  Tiernan has been advised to work up to 150 minutes of moderate intensity aerobic activity a week and strengthening exercises 2-3 times per week for cardiovascular health, weight loss maintenance and preservation of muscle mass.   She has agreed to :  Think about enjoyable ways to increase daily physical activity and overcoming barriers to exercise and Increase physical activity in their day and reduce sedentary time (increase NEAT).  Pharmacotherapy  We discussed various medication options to help Jasmene with her weight loss efforts and we both agreed to : continue with nutritional and behavioral strategies and patient would benefit from switching to [facility] or Ozempic as this will provide more effective treatment for her comorbid conditions and assist with her weight loss efforts  Associated Conditions Addressed Today  Assessment and Plan    Type 2 Diabetes Mellitus   Her diabetes is not well controlled, evidenced by an A1c of 8.4%. She is currently on metformin but has not been taking Trulicity due to its cost. We discussed the importance of controlling blood glucose levels to prevent complications such as retinopathy, nephropathy, and neuropathy. We emphasized the role of diet and exercise in management and discussed the potential benefits of Mounjaro or Ozempic for diabetes control, weight management, and liver health. We addressed cost and insurance coverage issues with Trulicity, Mounjaro, and alternatives like Ozempic and Victoza. We will increase metformin to 1000 mg twice daily with food and discuss the potential addition of Mounjaro or Ozempic pending insurance coverage. A prescription for increased metformin will be sent to [facility] in [place]. She is encouraged to follow a high protein, low carb diet with 1500 calories per day and to use a tracking app for diet and  exercise.  Obesity   Her obesity is contributing to insulin resistance and hyperinsulinemia. We discussed the role of weight loss in improving insulin sensitivity and overall health, emphasizing the importance of a balanced diet and regular physical activity. We explained that reducing body fat and increasing physical activity will improve insulin sensitivity and aid weight loss. We discussed using an air fryer for healthier meal preparation. She is encouraged to follow a high protein, low carbohydrate diet, aim for 1500 calories per day, engage in regular physical activity with 150 minutes of aerobic exercise per week and 2-3 strength training sessions per week, and consider using an air fryer for healthier meal preparation.  Metabolic Dysfunction-Associated Steatotic Liver Disease (MASLD)   Elevated liver enzymes indicate fatty liver disease. We discussed the impact of weight loss and dietary changes on liver health, advising her to avoid alcohol and reduce saturated fats. We explained that losing 15% of body weight can significantly improve liver health. We also discussed the potential benefits of Mounjaro or Ozempic for liver health pending insurance coverage. She is encouraged to aim for a weight loss of 15% of body weight, avoid alcohol, and reduce intake of saturated fats.  Hypertension   Her blood pressure is well controlled. We discussed the importance of maintaining a healthy diet and regular exercise to manage blood pressure. She is to continue current blood pressure management and monitor blood pressure regularly.  Hyperlipidemia   We discussed the importance of managing cholesterol levels through diet and medication, emphasizing reducing saturated  fats and increasing physical activity.  She is currently on atorvastatin 10 mg once a day she is to review cholesterol levels at the next PCP visit, follow a diet low in saturated fats, and engage in regular physical activity.  Vitamin D  Deficiency   Her vitamin D levels are low, likely due to excess adiposity.  We discussed the importance of vitamin D for overall health. She will start vitamin D3 2000 units daily over the counter.  Hirsutism Because of time restriction today this will be further addressed at the next office visit.    Follow-up   A follow-up appointment is scheduled in 3 weeks for 40 minutes to discuss cancer risk, cardiovascular risk, and physical activity goals.             Objective   Physical Exam:  Blood pressure 125/77, pulse 76, temperature (!) 97.5 F (36.4 C), height 5\' 6"  (1.676 m), weight 257 lb (116.6 kg), SpO2 98%. Body mass index is 41.48 kg/m.  General: She is overweight, cooperative, alert, well developed, and in no acute distress. PSYCH: Has normal mood, affect and thought process.   HEENT: EOMI, sclerae are anicteric. Lungs: Normal breathing effort, no conversational dyspnea. Extremities: No edema.  Neurologic: No gross sensory or motor deficits. No tremors or fasciculations noted.    Diagnostic Data Reviewed:  BMET    Component Value Date/Time   NA 139 11/22/2022 1023   K 4.9 11/22/2022 1023   CL 99 11/22/2022 1023   CO2 25 11/22/2022 1023   GLUCOSE 148 (H) 11/22/2022 1023   GLUCOSE 91 01/20/2022 1244   BUN 6 11/22/2022 1023   CREATININE 1.03 (H) 11/22/2022 1023   CALCIUM 9.3 11/22/2022 1023   GFRNONAA >60 01/20/2022 1244   GFRAA >60 07/23/2019 1426   Lab Results  Component Value Date   HGBA1C 8.4 (H) 11/22/2022   Lab Results  Component Value Date   INSULIN 40.3 (H) 11/22/2022   Lab Results  Component Value Date   TSH 1.296 04/13/2017   CBC    Component Value Date/Time   WBC 8.1 01/20/2022 1244   RBC 5.49 (H) 01/20/2022 1244   HGB 14.5 01/20/2022 1244   HCT 46.4 (H) 01/20/2022 1244   PLT 230 01/20/2022 1244   MCV 84.5 01/20/2022 1244   MCH 26.4 01/20/2022 1244   MCHC 31.3 01/20/2022 1244   RDW 15.2 01/20/2022 1244   Iron Studies     Component Value Date/Time   FERRITIN 319 (H) 03/19/2019 0835   Lipid Panel     Component Value Date/Time   TRIG 256 (H) 03/19/2019 0835   Hepatic Function Panel     Component Value Date/Time   PROT 7.0 11/22/2022 1023   ALBUMIN 4.0 11/22/2022 1023   AST 25 11/22/2022 1023   ALT 33 (H) 11/22/2022 1023   ALKPHOS 76 11/22/2022 1023   BILITOT 0.5 11/22/2022 1023      Component Value Date/Time   TSH 1.296 04/13/2017 1053   Nutritional Lab Results  Component Value Date   VD25OH 27.2 (L) 11/22/2022    Follow-Up   No follow-ups on file.Marland Kitchen She was informed of the importance of frequent follow up visits to maximize her success with intensive lifestyle modifications for her multiple health conditions.  Attestation Statement   Reviewed by clinician on day of visit: allergies, medications, problem list, medical history, surgical history, family history, social history, and previous encounter notes.   I have spent 40 minutes in the care of the patient  today including: preparing to see patient (e.g. review and interpretation of tests, old notes ), obtaining and/or reviewing separately obtained history, performing a medically appropriate examination or evaluation, counseling and educating the patient, ordering medications, test or procedures, documenting clinical information in the electronic or other health care record, and independently interpreting results and communicating results to the patient, family, or caregiver   Worthy Rancher, MD

## 2022-12-07 DIAGNOSIS — E559 Vitamin D deficiency, unspecified: Secondary | ICD-10-CM | POA: Insufficient documentation

## 2022-12-07 MED ORDER — VITAMIN D3 50 MCG (2000 UT) PO CAPS: 2000.0000 [IU] | ORAL_CAPSULE | Freq: Every day | ORAL | Status: AC

## 2022-12-27 ENCOUNTER — Ambulatory Visit (INDEPENDENT_AMBULATORY_CARE_PROVIDER_SITE_OTHER): Payer: Self-pay | Admitting: Adult Health

## 2023-01-04 ENCOUNTER — Encounter (INDEPENDENT_AMBULATORY_CARE_PROVIDER_SITE_OTHER): Payer: Self-pay | Admitting: Internal Medicine

## 2023-01-04 ENCOUNTER — Telehealth (INDEPENDENT_AMBULATORY_CARE_PROVIDER_SITE_OTHER): Payer: 59 | Admitting: Internal Medicine

## 2023-01-04 VITALS — Ht 66.0 in | Wt 256.0 lb

## 2023-01-04 DIAGNOSIS — E1169 Type 2 diabetes mellitus with other specified complication: Secondary | ICD-10-CM

## 2023-01-04 DIAGNOSIS — Z7985 Long-term (current) use of injectable non-insulin antidiabetic drugs: Secondary | ICD-10-CM | POA: Diagnosis not present

## 2023-01-04 DIAGNOSIS — Z6841 Body Mass Index (BMI) 40.0 and over, adult: Secondary | ICD-10-CM | POA: Diagnosis not present

## 2023-01-04 DIAGNOSIS — K760 Fatty (change of) liver, not elsewhere classified: Secondary | ICD-10-CM

## 2023-01-04 NOTE — Progress Notes (Deleted)
Office: [phone]  /  Fax: [phone]  I connected with  [name] on 01/04/23 by a video enabled telemedicine application and verified that I am speaking with the correct person using two identifiers.  Patient location: Home, provider location: Office   I discussed the limitations of evaluation and management by telemedicine. The patient expressed understanding and agreed to proceed.     Weight Summary And Biometrics  No data recorded Anthropometric Measurements Height: 5\' 6"  (1.676 m) Weight: 256 lb (116.1 kg) BMI (Calculated): 41.34   No data recorded  No data recorded Today's Visit #: 3  No data recorded  Subjective   Chief Complaint: Obesity  Alyzah is here to discuss her progress with her obesity treatment plan. She is on the the Category 3 Plan and states she is following her eating plan approximately 60 % of the time. She states she is exercising walking 15 minutes 4 times per week.  Interval History:   Since last office visit she has lost 1 pounds. She reports good adherence to reduced calorie nutritional plan. She has been working on reading food labels, not skipping meals, increasing protein intake at every meal, drinking more water, making healthier choices, reducing portion sizes, and incorporating more whole foods   Orexigenic Control:  Reports problems with appetite and hunger signals.  Denies problems with satiety and satiation.  Denies problems with eating patterns and portion control.  Denies abnormal cravings. Denies feeling deprived or restricted.   Barriers identified: {EMOBESITYBARRIERS:28841::"none"}.   Pharmacotherapy for weight loss: She is currently taking {EMPharmaco:28845}.   Assessment and Plan   Treatment Plan For Obesity:  Recommended Dietary Goals  Latiffany is currently in the action stage of change. As such, her goal is to continue weight management plan. She has agreed to: {EMWTLOSSPLAN:29297::"continue current  plan"}  Behavioral Intervention  We discussed the following Behavioral Modification Strategies today: {EMWMwtlossstrategies:28914::"continue to work on maintaining a reduced calorie state, getting the recommended amount of protein, incorporating whole foods, making healthy choices, staying well hydrated and practicing mindfulness when eating."}.  Additional resources provided today: {EMadditionalresources:29169::"None"}  Recommended Physical Activity Goals  Harjit has been advised to work up to 150 minutes of moderate intensity aerobic activity a week and strengthening exercises 2-3 times per week for cardiovascular health, weight loss maintenance and preservation of muscle mass.   She has agreed to :  {EMEXERCISE:28847::"Think about enjoyable ways to increase daily physical activity and overcoming barriers to exercise","Increase physical activity in their day and reduce sedentary time (increase NEAT)."}  Pharmacotherapy  We discussed various medication options to help Kaliyan with her weight loss efforts and we both agreed to : {EMagreedrx:29170::"continue with nutritional and behavioral strategies"}  Associated Conditions Addressed Today  There are no diagnoses linked to this encounter.   Objective   Physical Exam:  Height 5\' 6"  (1.676 m), weight 256 lb (116.1 kg). Body mass index is 41.32 kg/m.  General: She is overweight, cooperative, alert, well developed, and in no acute distress. PSYCH: Has normal mood, affect and thought process.   HEENT: EOMI, sclerae are anicteric. Lungs: Normal breathing effort, no conversational dyspnea. Extremities: No edema.  Neurologic: No gross sensory or motor deficits. No tremors or fasciculations noted.    Diagnostic Data Reviewed:  BMET    Component Value Date/Time   NA 139 11/22/2022 1023   K 4.9 11/22/2022 1023   CL 99 11/22/2022 1023   CO2 25 11/22/2022 1023   GLUCOSE 148 (H) 11/22/2022 1023   GLUCOSE 91 01/20/2022  1244   BUN 6  11/22/2022 1023   CREATININE 1.03 (H) 11/22/2022 1023   CALCIUM 9.3 11/22/2022 1023   GFRNONAA >60 01/20/2022 1244   GFRAA >60 07/23/2019 1426   Lab Results  Component Value Date   HGBA1C 8.4 (H) 11/22/2022   Lab Results  Component Value Date   INSULIN 40.3 (H) 11/22/2022   Lab Results  Component Value Date   TSH 1.296 04/13/2017   CBC    Component Value Date/Time   WBC 8.1 01/20/2022 1244   RBC 5.49 (H) 01/20/2022 1244   HGB 14.5 01/20/2022 1244   HCT 46.4 (H) 01/20/2022 1244   PLT 230 01/20/2022 1244   MCV 84.5 01/20/2022 1244   MCH 26.4 01/20/2022 1244   MCHC 31.3 01/20/2022 1244   RDW 15.2 01/20/2022 1244   Iron Studies    Component Value Date/Time   FERRITIN 319 (H) 03/19/2019 0835   Lipid Panel     Component Value Date/Time   TRIG 256 (H) 03/19/2019 0835   Hepatic Function Panel     Component Value Date/Time   PROT 7.0 11/22/2022 1023   ALBUMIN 4.0 11/22/2022 1023   AST 25 11/22/2022 1023   ALT 33 (H) 11/22/2022 1023   ALKPHOS 76 11/22/2022 1023   BILITOT 0.5 11/22/2022 1023      Component Value Date/Time   TSH 1.296 04/13/2017 1053   Nutritional Lab Results  Component Value Date   VD25OH 27.2 (L) 11/22/2022    Follow-Up   No follow-ups on file.Marland Kitchen She was informed of the importance of frequent follow up visits to maximize her success with intensive lifestyle modifications for her multiple health conditions.  Attestation Statement   Reviewed by clinician on day of visit: allergies, medications, problem list, medical history, surgical history, family history, social history, and previous encounter notes.     Worthy Rancher, MD

## 2023-01-04 NOTE — Assessment & Plan Note (Signed)
 See obesity treatment plan

## 2023-01-04 NOTE — Assessment & Plan Note (Addendum)
Most recent A1c is in the 8 range which is not well-controlled.  We attempted to increase her metformin but this was met with GI side effects.  She is currently on Trulicity which is not as effective as Ozempic or Mounjaro as it relates to assistance with weight loss and management of other comorbid conditions.  She has multiple obesity related conditions and therefore benefits from newer generation GLP-1 therapy.  We will likely start Mounjaro at the next office visit.  She will continue on Trulicity and lower metformin to 1 tablet for now.  She also will continue to work on reducing simple and added sugars in her diet.  Also working on implementing reduced calorie nutrition plan

## 2023-01-04 NOTE — Progress Notes (Signed)
Office: [phone]  /  Fax: [phone]  I connected with  Vanessa George on 01/04/23 by a video enabled telemedicine application and verified that I am speaking with the correct person using two identifiers.  Patient location: Home provider location: Office  I discussed the limitations of evaluation and management by telemedicine. The patient expressed understanding and agreed to proceed.   Weight Summary And Biometrics  No data recorded Anthropometric Measurements Height: 5\' 6"  (1.676 m) Weight: 256 lb (116.1 kg) BMI (Calculated): 41.34   No data recorded  No data recorded Today's Visit #: 3  No data recorded  Subjective   Chief Complaint: Obesity  Vanessa George is here to discuss her progress with her obesity treatment plan. She is on the the Category 3 Plan and states she is following her eating plan approximately 60% of the time. She states she is exercising 15 minutes minutes 4 times per week.  Interval History:   Since last office visit she has lost 1 pounds. She reports good adherence to reduced calorie nutritional plan. She has been working on reading food labels, not skipping meals, increasing protein intake at every meal, drinking more water, making healthier choices, reducing portion sizes, and incorporating more whole foods   Last office visit we had increased her metformin to 500 mg twice a day but she has been experiencing loose stools with the morning dose particularly on her way to work.  We also are trying to switch her from Trulicity to [employer] or [employer].  She is currently looking for new insurance in the marketplace.  She had questions about coverage which were addressed during this encounter.  She reports having a cold for the last 7 days and has some nasal congestion.  Orexigenic Control:  Reports problems with appetite and hunger signals.  Denies problems with satiety and satiation.  Denies problems with eating patterns and portion control.  Denies  abnormal cravings. Denies feeling deprived or restricted.   Barriers identified: low volume of physical activity at present  and medical comorbidities.   Pharmacotherapy for weight loss: She is currently taking no anti-obesity medication.   Assessment and Plan   Treatment Plan For Obesity:  Recommended Dietary Goals  [name] is currently in the action stage of change. As such, her goal is to continue weight management plan. She has agreed to: continue current plan  Behavioral Intervention  We discussed the following Behavioral Modification Strategies today: continue to work on maintaining a reduced calorie state, getting the recommended amount of protein, incorporating whole foods, making healthy choices, staying well hydrated and practicing mindfulness when eating..  Additional resources provided today: None  Recommended Physical Activity Goals  [name] has been advised to work up to 150 minutes of moderate intensity aerobic activity a week and strengthening exercises 2-3 times per week for cardiovascular health, weight loss maintenance and preservation of muscle mass.   She has agreed to :  continue to gradually increase the amount and intensity of exercise   Pharmacotherapy  We discussed various medication options to help [name] with her weight loss efforts and we both agreed to :  Switching to [facility] in January for diabetes management and assistance with weight loss.  Associated Conditions Addressed Today  Metabolic dysfunction-associated steatotic liver disease (MASLD) Assessment & Plan: I reviewed CMP from July 2024 in Care Everywhere.  Her ALT was 35 AST was 23.  She had a CT scan on June 2023 for renal stones that showed mild hepatic steatosis.  She does not  drink alcohol and denies family history of liver disease.  She does not take over-the-counter supplements.  We will complete first tier workup at the next office visit which will likely include hepatitis serologies and  iron studies.   She will work on reducing saturated fats, simple and added sugars in her diet.  Losing 15% of body weight will improve condition.  She is also a candidate for incretin therapy.  She is looking into insurance coverage and will likely start Mounjaro in January.   Type 2 diabetes mellitus with other specified complication, without long-term current use of insulin (HCC) Assessment & Plan: Most recent A1c is in the 8 range which is not well-controlled.  We attempted to increase her metformin but this was met with GI side effects.  She is currently on Trulicity which is not as effective as Ozempic or Mounjaro as it relates to assistance with weight loss and management of other comorbid conditions.  She has multiple obesity related conditions and therefore benefits from newer generation GLP-1 therapy.  We will likely start Mounjaro at the next office visit.  She will continue on Trulicity and lower metformin to 1 tablet for now.  She also will continue to work on reducing simple and added sugars in her diet.  Also working on implementing reduced calorie nutrition plan   Morbid obesity (HCC), Starting BMI 42.63 Assessment & Plan: See obesity treatment plan      Objective   Physical Exam:  Height 5\' 6"  (1.676 m), weight 256 lb (116.1 kg). Body mass index is 41.32 kg/m.  General: She is overweight, cooperative, alert, well developed, and in no acute distress. PSYCH: Has normal mood, affect and thought process.   HEENT: EOMI, sclerae are anicteric. Lungs: Normal breathing effort, no conversational dyspnea. Extremities: No edema.  Neurologic: No gross sensory or motor deficits. No tremors or fasciculations noted.    Diagnostic Data Reviewed:  BMET    Component Value Date/Time   NA 139 11/22/2022 1023   K 4.9 11/22/2022 1023   CL 99 11/22/2022 1023   CO2 25 11/22/2022 1023   GLUCOSE 148 (H) 11/22/2022 1023   GLUCOSE 91 01/20/2022 1244   BUN 6 11/22/2022 1023    CREATININE 1.03 (H) 11/22/2022 1023   CALCIUM 9.3 11/22/2022 1023   GFRNONAA >60 01/20/2022 1244   GFRAA >60 07/23/2019 1426   Lab Results  Component Value Date   HGBA1C 8.4 (H) 11/22/2022   Lab Results  Component Value Date   INSULIN 40.3 (H) 11/22/2022   Lab Results  Component Value Date   TSH 1.296 04/13/2017   CBC    Component Value Date/Time   WBC 8.1 01/20/2022 1244   RBC 5.49 (H) 01/20/2022 1244   HGB 14.5 01/20/2022 1244   HCT 46.4 (H) 01/20/2022 1244   PLT 230 01/20/2022 1244   MCV 84.5 01/20/2022 1244   MCH 26.4 01/20/2022 1244   MCHC 31.3 01/20/2022 1244   RDW 15.2 01/20/2022 1244   Iron Studies    Component Value Date/Time   FERRITIN 319 (H) 03/19/2019 0835   Lipid Panel     Component Value Date/Time   TRIG 256 (H) 03/19/2019 0835   Hepatic Function Panel     Component Value Date/Time   PROT 7.0 11/22/2022 1023   ALBUMIN 4.0 11/22/2022 1023   AST 25 11/22/2022 1023   ALT 33 (H) 11/22/2022 1023   ALKPHOS 76 11/22/2022 1023   BILITOT 0.5 11/22/2022 1023      Component  Value Date/Time   TSH 1.296 04/13/2017 1053   Nutritional Lab Results  Component Value Date   VD25OH 27.2 (L) 11/22/2022    Follow-Up   No follow-ups on file.Marland Kitchen She was informed of the importance of frequent follow up visits to maximize her success with intensive lifestyle modifications for her multiple health conditions.  Attestation Statement   Reviewed by clinician on day of visit: allergies, medications, problem list, medical history, surgical history, family history, social history, and previous encounter notes.   Total visit time 20 minutes  Worthy Rancher, MD

## 2023-01-04 NOTE — Assessment & Plan Note (Addendum)
I reviewed CMP from July 2024 in Care Everywhere.  Her ALT was 35 AST was 23.  She had a CT scan on June 2023 for renal stones that showed mild hepatic steatosis.  She does not drink alcohol and denies family history of liver disease.  She does not take over-the-counter supplements.  We will complete first tier workup at the next office visit which will likely include hepatitis serologies and iron studies.   She will work on reducing saturated fats, simple and added sugars in her diet.  Losing 15% of body weight will improve condition.  She is also a candidate for incretin therapy.  She is looking into insurance coverage and will likely start Mounjaro in January.

## 2023-02-08 ENCOUNTER — Ambulatory Visit (INDEPENDENT_AMBULATORY_CARE_PROVIDER_SITE_OTHER): Payer: 59 | Admitting: Internal Medicine

## 2023-02-08 ENCOUNTER — Encounter (INDEPENDENT_AMBULATORY_CARE_PROVIDER_SITE_OTHER): Payer: Self-pay | Admitting: Internal Medicine

## 2023-02-08 VITALS — BP 126/78 | HR 80 | Temp 97.6°F | Ht 66.0 in | Wt 259.0 lb

## 2023-02-08 DIAGNOSIS — E1169 Type 2 diabetes mellitus with other specified complication: Secondary | ICD-10-CM | POA: Diagnosis not present

## 2023-02-08 DIAGNOSIS — K760 Fatty (change of) liver, not elsewhere classified: Secondary | ICD-10-CM

## 2023-02-08 DIAGNOSIS — I10 Essential (primary) hypertension: Secondary | ICD-10-CM | POA: Diagnosis not present

## 2023-02-08 DIAGNOSIS — Z6841 Body Mass Index (BMI) 40.0 and over, adult: Secondary | ICD-10-CM

## 2023-02-08 DIAGNOSIS — Z7985 Long-term (current) use of injectable non-insulin antidiabetic drugs: Secondary | ICD-10-CM

## 2023-02-08 MED ORDER — TIRZEPATIDE 5 MG/0.5ML ~~LOC~~ SOAJ: 5.0000 mg | SUBCUTANEOUS | 0 refills | Status: DC

## 2023-02-08 NOTE — Assessment & Plan Note (Signed)
 According to outside records her A1c has gone down to 7.8 from 8.4.  This was while she was on metformin .  She is now on Mounjaro so anticipate further improvements.  We will increase medication to 5 mg once a week.  Patient has been educated on the carb insulin  model of obesity and is aware of the importance of maintaining a diet with a low glycemic load.

## 2023-02-08 NOTE — Assessment & Plan Note (Signed)
 See obesity treatment plan

## 2023-02-08 NOTE — Assessment & Plan Note (Signed)
 I reviewed CMP from July 2024 in Care Everywhere.  Her ALT was 35 AST was 23.  She had a CT scan on June 2023 for renal stones that showed mild hepatic steatosis.  She does not drink alcohol and denies family history of liver disease.  She does not take over-the-counter supplements.  We will complete first tier workup at the next office visit which will likely include hepatitis serologies and iron studies.   She will work on reducing saturated fats, simple and added sugars in her diet.  Losing 15% of body weight will improve condition.  She is also a candidate for incretin therapy.  She is looking into insurance coverage and will likely start Mounjaro in January.

## 2023-02-08 NOTE — Progress Notes (Signed)
 Office: [phone]  /  Fax: [phone]  Weight Summary And Biometrics  Vitals Temp: 97.6 F (36.4 C) BP: 126/78 Pulse Rate: 80 SpO2: 100 %   Anthropometric Measurements Height: 5\' 6"  (1.676 m) Weight: 259 lb (117.5 kg) BMI (Calculated): 41.82 Weight at Last Visit: 257 lb Weight Lost Since Last Visit: 0 lb Weight Gained Since Last Visit: 3 lb Starting Weight: 257 lb Total Weight Loss (lbs): 0 lb (0 kg) Peak Weight: 264 lb   Body Composition  Body Fat %: 46 % Fat Mass (lbs): 119.4 lbs Muscle Mass (lbs): 133.2 lbs Total Body Water (lbs): 104.6 lbs Visceral Fat Rating : 15    RMR: 1987  Today's Visit #: 4  Starting Date: 11/22/22   Subjective   Chief Complaint: Obesity  Vanessa George is here to discuss her progress with her obesity treatment plan. She is on the the Category 3 Plan and states she is following her eating plan approximately 50 % of the time. She states she is not exercising.  Interval History:   Vanessa George presents today for medical weight management.  She has new insurance and has now started Mounjaro 2.5 mg once a week she has done 1 out of 4 injections.  This was started by her primary care physician.  She denies any adverse effects Since last office visit she has gained 3 pounds. She reports working on implementation of reduced calorie nutritional plan She has been working on not skipping meals, increasing protein intake at every meal, eating more fruits, eating more vegetables, drinking more water, making healthier choices, thinking of starting to exercise, reducing portion sizes, and incorporating more whole foods    Orexigenic Control:  Reports problems with appetite and hunger signals.  Reports problems with satiety and satiation.  Denies problems with eating patterns and portion control.  Denies abnormal cravings. Denies feeling deprived or restricted.   Barriers identified: low volume of physical activity at present , medical comorbidities,  and difficulty maintaining a reduced calorie state.   Pharmacotherapy for weight loss: She is currently taking Monjauro with diabetes as the primary indication with adequate clinical response  and without side effects..   Assessment and Plan   Treatment Plan For Obesity:  Recommended Dietary Goals  Vanessa George is currently in the action stage of change. As such, her goal is to continue weight management plan. She has agreed to: continue current plan  Behavioral Intervention  We discussed the following Behavioral Modification Strategies today: continue to work on maintaining a reduced calorie state, getting the recommended amount of protein, incorporating whole foods, making healthy choices, staying well hydrated and practicing mindfulness when eating..  Additional resources provided today: None  Recommended Physical Activity Goals  Vanessa George has been advised to work up to 150 minutes of moderate intensity aerobic activity a week and strengthening exercises 2-3 times per week for cardiovascular health, weight loss maintenance and preservation of muscle mass.   She has agreed to :  Think about enjoyable ways to increase daily physical activity and overcoming barriers to exercise and Increase physical activity in their day and reduce sedentary time (increase NEAT).  Pharmacotherapy  We discussed various medication options to help Vanessa George with her weight loss efforts and we both agreed to : increase Mounjaro to 5 mg once a week  Associated Conditions Addressed and Impacted by Obesity Treatment  Metabolic dysfunction-associated steatotic liver disease (MASLD) Assessment & Plan: I reviewed CMP from July 2024 in Care Everywhere.  Her ALT was 35 AST was 23.  She had a CT scan on June 2023 for renal stones that showed mild hepatic steatosis.  She does not drink alcohol and denies family history of liver disease.  She does not take over-the-counter supplements.  We will complete first tier workup at  the next office visit which will likely include hepatitis serologies and iron studies.   She will work on reducing saturated fats, simple and added sugars in her diet.  Losing 15% of body weight will improve condition.  She is also a candidate for incretin therapy.  She is looking into insurance coverage and will likely start Mounjaro in January.   Type 2 diabetes mellitus with other specified complication, without long-term current use of insulin  [facility]) Assessment & Plan: According to outside records her A1c has gone down to 7.8 from 8.4.  This was while she was on metformin .  She is now on Mounjaro so anticipate further improvements.  We will increase medication to 5 mg once a week.  Patient has been educated on the carb insulin  model of obesity and is aware of the importance of maintaining a diet with a low glycemic load.  Orders: -     Tirzepatide; Inject 5 mg into the skin once a week.  Dispense: 2 mL; Refill: 0  Morbid obesity (HCC), Starting BMI 42.63 Assessment & Plan: See obesity treatment plan   Primary hypertension Assessment & Plan: Her blood pressure has improved significantly.  She is currently on amlodipine  and olmesartan.   She will continue current regimen.  Losing 10% of body weight may improve blood pressure control.  She will monitor for orthostasis while losing weight.      Objective   Physical Exam:  Blood pressure 126/78, pulse 80, temperature 97.6 F (36.4 C), height 5\' 6"  (1.676 m), weight 259 lb (117.5 kg), SpO2 100%. Body mass index is 41.8 kg/m.  General: She is overweight, cooperative, alert, well developed, and in no acute distress. PSYCH: Has normal mood, affect and thought process.   HEENT: EOMI, sclerae are anicteric. Lungs: Normal breathing effort, no conversational dyspnea. Extremities: No edema.  Neurologic: No gross sensory or motor deficits. No tremors or fasciculations noted.    Diagnostic Data Reviewed:  BMET    Component Value  Date/Time   NA 139 11/22/2022 1023   K 4.9 11/22/2022 1023   CL 99 11/22/2022 1023   CO2 25 11/22/2022 1023   GLUCOSE 148 (H) 11/22/2022 1023   GLUCOSE 91 01/20/2022 1244   BUN 6 11/22/2022 1023   CREATININE 1.03 (H) 11/22/2022 1023   CALCIUM 9.3 11/22/2022 1023   GFRNONAA >60 01/20/2022 1244   GFRAA >60 07/23/2019 1426   Lab Results  Component Value Date   HGBA1C 8.4 (H) 11/22/2022   Lab Results  Component Value Date   INSULIN  40.3 (H) 11/22/2022   Lab Results  Component Value Date   TSH 1.296 04/13/2017   CBC    Component Value Date/Time   WBC 8.1 01/20/2022 1244   RBC 5.49 (H) 01/20/2022 1244   HGB 14.5 01/20/2022 1244   HCT 46.4 (H) 01/20/2022 1244   PLT 230 01/20/2022 1244   MCV 84.5 01/20/2022 1244   MCH 26.4 01/20/2022 1244   MCHC 31.3 01/20/2022 1244   RDW 15.2 01/20/2022 1244   Iron Studies    Component Value Date/Time   FERRITIN 319 (H) 03/19/2019 0835   Lipid Panel     Component Value Date/Time   TRIG 256 (H) 03/19/2019 0835   Hepatic Function Panel  Component Value Date/Time   PROT 7.0 11/22/2022 1023   ALBUMIN 4.0 11/22/2022 1023   AST 25 11/22/2022 1023   ALT 33 (H) 11/22/2022 1023   ALKPHOS 76 11/22/2022 1023   BILITOT 0.5 11/22/2022 1023      Component Value Date/Time   TSH 1.296 04/13/2017 1053   Nutritional Lab Results  Component Value Date   VD25OH 27.2 (L) 11/22/2022    Follow-Up   Return in about 3 weeks (around 03/01/2023) for For Weight Mangement with Dr. Allie Area.Aaron Aas She was informed of the importance of frequent follow up visits to maximize her success with intensive lifestyle modifications for her multiple health conditions.  Attestation Statement   Reviewed by clinician on day of visit: allergies, medications, problem list, medical history, surgical history, family history, social history, and previous encounter notes.     Ladd Picker, MD

## 2023-02-08 NOTE — Assessment & Plan Note (Signed)
 Her blood pressure has improved significantly.  She is currently on amlodipine  and olmesartan.   She will continue current regimen.  Losing 10% of body weight may improve blood pressure control.  She will monitor for orthostasis while losing weight.

## 2023-03-01 ENCOUNTER — Encounter (INDEPENDENT_AMBULATORY_CARE_PROVIDER_SITE_OTHER): Payer: Self-pay | Admitting: Internal Medicine

## 2023-03-01 ENCOUNTER — Ambulatory Visit (INDEPENDENT_AMBULATORY_CARE_PROVIDER_SITE_OTHER): Payer: 59 | Admitting: Internal Medicine

## 2023-03-01 VITALS — BP 138/84 | HR 75 | Temp 97.4°F | Ht 66.0 in | Wt 254.0 lb

## 2023-03-01 DIAGNOSIS — K760 Fatty (change of) liver, not elsewhere classified: Secondary | ICD-10-CM

## 2023-03-01 DIAGNOSIS — E1169 Type 2 diabetes mellitus with other specified complication: Secondary | ICD-10-CM

## 2023-03-01 DIAGNOSIS — Z7984 Long term (current) use of oral hypoglycemic drugs: Secondary | ICD-10-CM

## 2023-03-01 DIAGNOSIS — Z6841 Body Mass Index (BMI) 40.0 and over, adult: Secondary | ICD-10-CM

## 2023-03-01 MED ORDER — LANCET DEVICE MISC: 1.0000 | Freq: Three times a day (TID) | 0 refills | Status: AC

## 2023-03-01 MED ORDER — BLOOD GLUCOSE TEST VI STRP: 1.0000 | ORAL_STRIP | Freq: Every day | 1 refills | Status: AC | PRN

## 2023-03-01 MED ORDER — LANCETS MISC. MISC: 1.0000 | Freq: Every day | 1 refills | Status: AC | PRN

## 2023-03-01 MED ORDER — METFORMIN HCL ER 500 MG PO TB24: 500.0000 mg | ORAL_TABLET | Freq: Every day | ORAL | 0 refills | Status: DC

## 2023-03-01 MED ORDER — BLOOD GLUCOSE MONITORING SUPPL DEVI: 1.0000 | Freq: Three times a day (TID) | 0 refills | Status: AC

## 2023-03-01 NOTE — Progress Notes (Signed)
 Office: [phone]  /  Fax: [phone]  Weight Summary And Biometrics  Vitals Temp: (!) 97.4 F (36.3 C) BP: 138/84 Pulse Rate: 75 SpO2: 98 %   Anthropometric Measurements Height: 5' 6 (1.676 m) Weight: 254 lb (115.2 kg) BMI (Calculated): 41.02 Weight at Last Visit: 259 lb Weight Lost Since Last Visit: 5 lb Weight Gained Since Last Visit: 0 lb Starting Weight: 257 lb Total Weight Loss (lbs): 3 lb (1.361 kg) Peak Weight: 264 lb   Body Composition  Body Fat %: 45 % Fat Mass (lbs): 114.4 lbs Muscle Mass (lbs): 132.6 lbs Total Body Water (lbs): 99.2 lbs Visceral Fat Rating : 14    RMR: 198  Today's Visit #: 5  Starting Date: 11/22/22   Subjective   Chief Complaint: Obesity  Vanessa George is here to discuss her progress with her obesity treatment plan. She is on the the Category 3 Plan and states she is following her eating plan approximately 50 % of the time. She states she is exercising 15-30 minutes 3 times per week.  Weight Progress Since Last Visit:  Discussed the use of AI scribe software for clinical note transcription with the patient, who gave verbal consent to proceed.  History of Present Illness   Vanessa George is a 53 year old female who presents for medical weight management.  She is currently undergoing medical weight management and has successfully lost five pounds since her last visit. She is taking metformin  once daily, having reduced from twice daily due to gastrointestinal issues. She ran out of metformin  but plans to refill it with a 90-day supply.  She experienced a low blood sugar episode one morning after not eating since the previous evening, feeling 'woozy' and managing the symptoms with orange juice and crackers. Her last A1c was 7.4% in January, prior to starting her current medication, indicating improvement from previous levels.  She mentions barriers to exercise due to her work schedule and other commitments but is trying to prioritize it  more. She has some exercise equipment at home, including three-pound dumbbells, and is considering additional equipment to facilitate home workouts.  Nutritionally, she is trying to eat balanced meals but finds it challenging due to feeling full quickly. She is eating small portions throughout the day to manage this.      Challenges affecting patient progress: work schedule and low volume of physical activity at present .   Orexigenic Control: Reports improved problems with appetite and hunger signals.  Reports improved problems with satiety and satiation.  Denies problems with eating patterns and portion control.  Denies abnormal cravings. Denies feeling deprived or restricted.   Pharmacotherapy for weight management: She is currently taking Monjauro with diabetes as the primary indication with adequate clinical response  and without side effects..   Assessment and Plan   Treatment Plan For Obesity:  Recommended Dietary Goals  Haeley is currently in the action stage of change. As such, her goal is to continue weight management plan. She has agreed to: continue current plan  Behavioral Health and Counseling  We discussed the following behavioral modification strategies today: continue to work on maintaining a reduced calorie state, getting the recommended amount of protein, incorporating whole foods, making healthy choices, staying well hydrated and practicing mindfulness when eating..  Additional education and resources provided today: None  Recommended Physical Activity Goals  Chen has been advised to work up to 150 minutes of moderate intensity aerobic activity a week and strengthening exercises 2-3 times per week for  cardiovascular health, weight loss maintenance and preservation of muscle mass.   She has agreed to :  Think about enjoyable ways to increase daily physical activity and overcoming barriers to exercise and Increase physical activity in their day and reduce  sedentary time (increase NEAT).  Pharmacotherapy  We discussed various medication options to help Ciarrah with her weight loss efforts and we both agreed to : increase Mounjaro  to 5 mg once a week primary indication diabetes secondary weight management.  We will avoid further increases due to restrictive eating  Associated Conditions Impacted by Obesity Treatment  Metabolic dysfunction-associated steatotic liver disease (MASLD) Assessment & Plan: I reviewed CMP from July 2024 in Care Everywhere.  Her ALT was 35 AST was 23.  She had a CT scan on June 2023 for renal stones that showed mild hepatic steatosis.  She does not drink alcohol and denies family history of liver disease.  She does not take over-the-counter supplements.   She will work on reducing saturated fats, simple and added sugars in her diet.  Losing 15% of body weight will improve condition.  She is now on Mounjaro  which will help treat condition.  We will repeat fasting liver enzymes in April and complete first tier evaluation including hepatitis serologies and iron studies if not done in the past.   Type 2 diabetes mellitus with other specified complication, without long-term current use of insulin  [facility]) Assessment & Plan: Last A1c in January was 7.4%. Experienced a hypoglycemic episode resolved with orange juice and crackers. Discussed monitoring blood glucose levels and recognizing/treating hypoglycemia. - Send prescription for Metformin  500 mg once daily for 90 days - Provide a blood glucose meter kit as she does not have 1 -Reviewed monitoring frequency as as needed with goals of care. - Educate on recognizing and treating hypoglycemia - Plan for fasting labs in April to check A1c and liver enzymes  Orders: -     metFORMIN  HCl ER; Take 1 tablet (500 mg total) by mouth daily with breakfast.  Dispense: 90 tablet; Refill: 0 -     Blood Glucose Monitoring Suppl; 1 each by Does not apply route in the morning, at noon, and at  bedtime. May substitute to any manufacturer covered by patient's insurance.  Dispense: 1 each; Refill: 0 -     Blood Glucose Test; 1 each by In Vitro route daily as needed for up to 60 doses. May substitute to any manufacturer covered by patient's insurance.  Dispense: 30 strip; Refill: 1 -     Lancet Device; 1 each by Does not apply route in the morning, at noon, and at bedtime. May substitute to any manufacturer covered by patient's insurance.  Dispense: 1 each; Refill: 0 -     Lancets Misc.; 1 each by Does not apply route daily as needed. May substitute to any manufacturer covered by patient's insurance.  Dispense: 30 each; Refill: 1  Morbid obesity (HCC), Starting BMI 42.63 Assessment & Plan: Patient has lost 5 pounds and is transitioning from 2.5 mg to 5 mg of Mounjaro . Emphasized balanced nutrition, adequate protein intake, and regular exercise to support weight management and prevent muscle loss. Addressed potential constipation issues related to Mounjaro  and provided dietary recommendations. - Transition to 5 mg Mounjaro  - Continue Metformin  500 mg once daily with breakfast - Increase intake of fruits, vegetables, and protein - Use Miralax or Docolax for constipation as needed - Encourage regular exercise, including using a walking pad or dumbbells at home - Plan for fasting  labs in April to check liver enzymes Emphasized regular exercise, balanced nutrition, hydration, and fiber intake to support overall health and prevent constipation. - Encourage regular exercise, including using a walking pad or dumbbells at home - Increase intake of fruits, vegetables, and protein - Stay hydrated         Objective   Physical Exam:  Blood pressure 138/84, pulse 75, temperature (!) 97.4 F (36.3 C), height 5' 6 (1.676 m), weight 254 lb (115.2 kg), SpO2 98%. Body mass index is 41 kg/m.  General: She is overweight, cooperative, alert, well developed, and in no acute distress. PSYCH: Has  normal mood, affect and thought process.   HEENT: EOMI, sclerae are anicteric. Lungs: Normal breathing effort, no conversational dyspnea. Extremities: No edema.  Neurologic: No gross sensory or motor deficits. No tremors or fasciculations noted.    Diagnostic Data Reviewed:  BMET    Component Value Date/Time   NA 139 11/22/2022 1023   K 4.9 11/22/2022 1023   CL 99 11/22/2022 1023   CO2 25 11/22/2022 1023   GLUCOSE 148 (H) 11/22/2022 1023   GLUCOSE 91 01/20/2022 1244   BUN 6 11/22/2022 1023   CREATININE 1.03 (H) 11/22/2022 1023   CALCIUM 9.3 11/22/2022 1023   GFRNONAA >60 01/20/2022 1244   GFRAA >60 07/23/2019 1426   Lab Results  Component Value Date   HGBA1C 8.4 (H) 11/22/2022   Lab Results  Component Value Date   INSULIN  40.3 (H) 11/22/2022   Lab Results  Component Value Date   TSH 1.296 04/13/2017   CBC    Component Value Date/Time   WBC 8.1 01/20/2022 1244   RBC 5.49 (H) 01/20/2022 1244   HGB 14.5 01/20/2022 1244   HCT 46.4 (H) 01/20/2022 1244   PLT 230 01/20/2022 1244   MCV 84.5 01/20/2022 1244   MCH 26.4 01/20/2022 1244   MCHC 31.3 01/20/2022 1244   RDW 15.2 01/20/2022 1244   Iron Studies    Component Value Date/Time   FERRITIN 319 (H) 03/19/2019 0835   Lipid Panel     Component Value Date/Time   TRIG 256 (H) 03/19/2019 0835   Hepatic Function Panel     Component Value Date/Time   PROT 7.0 11/22/2022 1023   ALBUMIN 4.0 11/22/2022 1023   AST 25 11/22/2022 1023   ALT 33 (H) 11/22/2022 1023   ALKPHOS 76 11/22/2022 1023   BILITOT 0.5 11/22/2022 1023      Component Value Date/Time   TSH 1.296 04/13/2017 1053   Nutritional Lab Results  Component Value Date   VD25OH 27.2 (L) 11/22/2022    Follow-Up   Return in about 4 weeks (around 03/29/2023) for For Weight Mangement with Dr. [email or web address] She was informed of the importance of frequent follow up visits to maximize her success with intensive lifestyle modifications for her multiple health  conditions.  Attestation Statement   Reviewed by clinician on day of visit: allergies, medications, problem list, medical history, surgical history, family history, social history, and previous encounter notes.     Lucas Francyne, MD

## 2023-03-01 NOTE — Assessment & Plan Note (Signed)
 I reviewed CMP from July 2024 in Care Everywhere.  Her ALT was 35 AST was 23.  She had a CT scan on June 2023 for renal stones that showed mild hepatic steatosis.  She does not drink alcohol and denies family history of liver disease.  She does not take over-the-counter supplements.   She will work on reducing saturated fats, simple and added sugars in her diet.  Losing 15% of body weight will improve condition.  She is now on [facility] which will help treat condition.  We will repeat fasting liver enzymes in April and complete first tier evaluation including hepatitis serologies and iron studies if not done in the past.

## 2023-03-01 NOTE — Assessment & Plan Note (Addendum)
 Patient has lost 5 pounds and is transitioning from 2.5 mg to 5 mg of Mounjaro . Emphasized balanced nutrition, adequate protein intake, and regular exercise to support weight management and prevent muscle loss. Addressed potential constipation issues related to Mounjaro  and provided dietary recommendations. - Transition to 5 mg Mounjaro  - Continue Metformin  500 mg once daily with breakfast - Increase intake of fruits, vegetables, and protein - Use Miralax or Docolax for constipation as needed - Encourage regular exercise, including using a walking pad or dumbbells at home - Plan for fasting labs in April to check liver enzymes Emphasized regular exercise, balanced nutrition, hydration, and fiber intake to support overall health and prevent constipation. - Encourage regular exercise, including using a walking pad or dumbbells at home - Increase intake of fruits, vegetables, and protein - Stay hydrated

## 2023-03-01 NOTE — Assessment & Plan Note (Signed)
 Last A1c in January was 7.4%. Experienced a hypoglycemic episode resolved with orange juice and crackers. Discussed monitoring blood glucose levels and recognizing/treating hypoglycemia. - Send prescription for Metformin  500 mg once daily for 90 days - Provide a blood glucose meter kit as she does not have 1 -Reviewed monitoring frequency as as needed with goals of care. - Educate on recognizing and treating hypoglycemia - Plan for fasting labs in April to check A1c and liver enzymes

## 2023-03-29 ENCOUNTER — Ambulatory Visit (INDEPENDENT_AMBULATORY_CARE_PROVIDER_SITE_OTHER): Payer: 59 | Admitting: Internal Medicine

## 2023-03-29 ENCOUNTER — Encounter (INDEPENDENT_AMBULATORY_CARE_PROVIDER_SITE_OTHER): Payer: Self-pay | Admitting: Internal Medicine

## 2023-03-29 VITALS — BP 140/79 | HR 86 | Temp 97.8°F | Ht 66.0 in | Wt 253.0 lb

## 2023-03-29 DIAGNOSIS — Z6841 Body Mass Index (BMI) 40.0 and over, adult: Secondary | ICD-10-CM | POA: Diagnosis not present

## 2023-03-29 DIAGNOSIS — E1169 Type 2 diabetes mellitus with other specified complication: Secondary | ICD-10-CM | POA: Diagnosis not present

## 2023-03-29 DIAGNOSIS — Z7984 Long term (current) use of oral hypoglycemic drugs: Secondary | ICD-10-CM

## 2023-03-29 DIAGNOSIS — K760 Fatty (change of) liver, not elsewhere classified: Secondary | ICD-10-CM

## 2023-03-29 DIAGNOSIS — Z7985 Long-term (current) use of injectable non-insulin antidiabetic drugs: Secondary | ICD-10-CM

## 2023-03-29 MED ORDER — TIRZEPATIDE 5 MG/0.5ML ~~LOC~~ SOAJ: 5.0000 mg | SUBCUTANEOUS | 0 refills | Status: DC

## 2023-03-29 MED ORDER — METFORMIN HCL ER 500 MG PO TB24: 500.0000 mg | ORAL_TABLET | Freq: Every day | ORAL | 0 refills | Status: AC

## 2023-03-29 MED ORDER — TIRZEPATIDE 7.5 MG/0.5ML ~~LOC~~ SOAJ: 7.5000 mg | SUBCUTANEOUS | 0 refills | Status: DC

## 2023-03-29 NOTE — Assessment & Plan Note (Addendum)
 She is affected by obesity and is currently on Mounjaro 5 mg once a week. She has lost one pound since the last visit, which is a positive outcome despite her challenges. Reports stress and inadequate time for self-care, impacting her ability to follow dietary and exercise plans. Emphasized the importance of consistent nutrition and the potential negative impact of skipping meals on metabolism, especially while on Mounjaro, which suppresses appetite.  -Work on reimplementation of [employer] management plan - Continue Mounjaro 5 mg once a week - Encourage consistent meal intake every 4-5 hours to maintain metabolic rate - Advise against skipping meals to prevent metabolic slowdown - Discuss the importance of intentional eating and meal planning - Schedule follow-up in 3 weeks to reassess progress

## 2023-03-29 NOTE — Progress Notes (Signed)
 Office: [phone]  /  Fax: [phone]  Weight Summary And Biometrics  Vitals Temp: 97.8 F (36.6 C) BP: (!) 140/79 Pulse Rate: 86 SpO2: 100 %   Anthropometric Measurements Height: 5\' 6"  (1.676 m) Weight: 253 lb (114.8 kg) BMI (Calculated): 40.85 Weight at Last Visit: 254 lb Weight Lost Since Last Visit: 1 lb Weight Gained Since Last Visit: 0 Starting Weight: 257 lb Total Weight Loss (lbs): 4 lb (1.814 kg) Peak Weight: 264 lb   Body Composition  Body Fat %: 44.7 % Fat Mass (lbs): 113.4 lbs Muscle Mass (lbs): 133.4 lbs Total Body Water (lbs): 98.2 lbs Visceral Fat Rating : 14    RMR: 1987  Today's Visit #: 6  Starting Date: 11/22/22   Subjective   Chief Complaint: Obesity  Interval History Discussed the use of AI scribe software for clinical note transcription with the patient, who gave verbal consent to proceed.  History of Present Illness   Vanessa George is a 53 year old female with obesity, metabolic dysfunction associated with steatotic liver disease, and type 2 diabetes who presents for medical weight management.  She has lost one pound since her last visit. Her dietary intake is tracked to 1500 calories and 90 grams of protein about 50-60% of the time. She consumes more hard foods and may not be meeting the recommended protein intake. Fluid intake is inadequate, and she occasionally skips breakfast and lunch. She has not engaged in exercise recently.  She is currently on Mounjaro 5 mg once a week and metformin 500 mg daily. Her appetite is generally stable, but she sometimes experiences hunger later in the evening, although she avoids eating past 8 PM. She has been taking metformin with her evening meal, despite the bottle suggesting morning intake.  Her A1c improved from 8.4% in October/November to 7.4% in January, indicating better diabetes management. She is managing her type 2 diabetes with metformin and Mounjaro.  Significant stress from personal  issues, including the passing of her aunt and grandmother, has impacted her focus on health goals. Family conflicts over her aunt's estate and responsibilities at work and church have left her feeling overwhelmed and without time for self-care. She feels tired and lacks time to exercise, although her schedule keeps her moving.        Challenges affecting patient progress: inadequate sleep and moderate to high levels of stress.    Pharmacotherapy for weight management: She is currently taking Monjauro with diabetes as the primary indication with adequate clinical response  and without side effects..   Assessment and Plan   Treatment Plan For Obesity:  Recommended Dietary Goals  [name] is currently in the action stage of change. As such, her goal is to continue weight management plan. She has agreed to: continue current plan  Behavioral Health and Counseling  We discussed the following behavioral modification strategies today: continue to work on maintaining a reduced calorie state, getting the recommended amount of protein, incorporating whole foods, making healthy choices, staying well hydrated and practicing mindfulness when eating..  Additional education and resources provided today: None  Recommended Physical Activity Goals  [name] has been advised to work up to 150 minutes of moderate intensity aerobic activity a week and strengthening exercises 2-3 times per week for cardiovascular health, weight loss maintenance and preservation of muscle mass.   She has agreed to :  Think about enjoyable ways to increase daily physical activity and overcoming barriers to exercise and Increase physical activity in their day and reduce  sedentary time (increase NEAT).  Pharmacotherapy  We discussed various medication options to help Amandalee with her weight loss efforts and we both agreed to : increase Mounjaro to 7.5 mg once a week  Associated Conditions Impacted by Obesity Treatment  Metabolic  dysfunction-associated steatotic liver disease (MASLD) Assessment & Plan: I reviewed CMP from July 2024 in Care Everywhere.  Her ALT was 35 AST was 23.  She had a CT scan on June 2023 for renal stones that showed mild hepatic steatosis.  She does not drink alcohol and denies family history of liver disease.  She does not take over-the-counter supplements.   She will work on reducing saturated fats, simple and added sugars in her diet.  Losing 15% of body weight will improve condition.  She is now on [facility] which will help treat condition.  We will repeat fasting liver enzymes in April and complete first tier evaluation including hepatitis serologies and iron studies if not done in the past.   Type 2 diabetes mellitus with other specified complication, without long-term current use of insulin (HCC) Assessment & Plan: Type 2 diabetes is managed with metformin 500 mg daily and Mounjaro 5 mg once a week. Her A1c improved from 8.4% in October/November to 7.4% in January, indicating better glycemic control. Discussed the importance of carbohydrate management and the role of complex carbohydrates in her diet. - Continue metformin 500 mg daily, can be taken with dinner -Increase Mounjaro to 7.5 mg once a week - Advise on carbohydrate management, focusing on complex carbohydrates such as whole grains, beans, sweet potatoes, and brown rice - Encourage portion control and balanced meals with protein and vegetables  Orders: -     metFORMIN HCl ER; Take 1 tablet (500 mg total) by mouth daily with breakfast.  Dispense: 90 tablet; Refill: 0 -     Tirzepatide; Inject 7.5 mg into the skin once a week.  Dispense: 2 mL; Refill: 0  Morbid obesity (HCC), Starting BMI 42.63 Assessment & Plan: She is affected by obesity and is currently on Mounjaro 5 mg once a week. She has lost one pound since the last visit, which is a positive outcome despite her challenges. Reports stress and inadequate time for self-care,  impacting her ability to follow dietary and exercise plans. Emphasized the importance of consistent nutrition and the potential negative impact of skipping meals on metabolism, especially while on Mounjaro, which suppresses appetite.  -Work on reimplementation of [employer] management plan - Continue Mounjaro 5 mg once a week - Encourage consistent meal intake every 4-5 hours to maintain metabolic rate - Advise against skipping meals to prevent metabolic slowdown - Discuss the importance of intentional eating and meal planning - Schedule follow-up in 3 weeks to reassess progress       General Health Maintenance Encouraged to maintain a healthy lifestyle through diet and exercise. Provided guidance on meal planning, portion control, and making healthy choices when eating out. Discussed the importance of tracking a 1500 calorie diet to increase accountability and provide immediate feedback. - Encourage meal planning and preparation to avoid decision fatigue - Advise on making healthy choices when eating out, such as choosing lean meats and vegetables - Recommend tracking a 1500 calorie diet to increase accountability and provide immediate feedback          Objective   Physical Exam:  Blood pressure (!) 140/79, pulse 86, temperature 97.8 F (36.6 C), height 5\' 6"  (1.676 m), weight 253 lb (114.8 kg), SpO2 100%. Body mass index  is 40.84 kg/m.  General: She is overweight, cooperative, alert, well developed, and in no acute distress. PSYCH: Has normal mood, affect and thought process.   HEENT: EOMI, sclerae are anicteric. Lungs: Normal breathing effort, no conversational dyspnea. Extremities: No edema.  Neurologic: No gross sensory or motor deficits. No tremors or fasciculations noted.    Diagnostic Data Reviewed:  BMET    Component Value Date/Time   NA 139 11/22/2022 1023   K 4.9 11/22/2022 1023   CL 99 11/22/2022 1023   CO2 25 11/22/2022 1023   GLUCOSE 148 (H) 11/22/2022 1023    GLUCOSE 91 01/20/2022 1244   BUN 6 11/22/2022 1023   CREATININE 1.03 (H) 11/22/2022 1023   CALCIUM 9.3 11/22/2022 1023   GFRNONAA >60 01/20/2022 1244   GFRAA >60 07/23/2019 1426   Lab Results  Component Value Date   HGBA1C 8.4 (H) 11/22/2022   Lab Results  Component Value Date   INSULIN 40.3 (H) 11/22/2022   Lab Results  Component Value Date   TSH 1.296 04/13/2017   CBC    Component Value Date/Time   WBC 8.1 01/20/2022 1244   RBC 5.49 (H) 01/20/2022 1244   HGB 14.5 01/20/2022 1244   HCT 46.4 (H) 01/20/2022 1244   PLT 230 01/20/2022 1244   MCV 84.5 01/20/2022 1244   MCH 26.4 01/20/2022 1244   MCHC 31.3 01/20/2022 1244   RDW 15.2 01/20/2022 1244   Iron Studies    Component Value Date/Time   FERRITIN 319 (H) 03/19/2019 0835   Lipid Panel     Component Value Date/Time   TRIG 256 (H) 03/19/2019 0835   Hepatic Function Panel     Component Value Date/Time   PROT 7.0 11/22/2022 1023   ALBUMIN 4.0 11/22/2022 1023   AST 25 11/22/2022 1023   ALT 33 (H) 11/22/2022 1023   ALKPHOS 76 11/22/2022 1023   BILITOT 0.5 11/22/2022 1023      Component Value Date/Time   TSH 1.296 04/13/2017 1053   Nutritional Lab Results  Component Value Date   VD25OH 27.2 (L) 11/22/2022    Medications: Outpatient Encounter Medications as of 03/29/2023  Medication Sig   amLODipine-olmesartan (AZOR) 5-40 MG tablet Take 1 tablet by mouth daily.   aspirin 81 MG chewable tablet Chew 81 mg by mouth daily.    atorvastatin (LIPITOR) 10 MG tablet Take 10 mg by mouth at bedtime.   Blood Glucose Monitoring Suppl DEVI 1 each by Does not apply route in the morning, at noon, and at bedtime. May substitute to any manufacturer covered by patient's insurance.   Cholecalciferol (VITAMIN D3) 50 MCG (2000 UT) capsule Take 1 capsule (2,000 Units total) by mouth daily.   diclofenac (VOLTAREN) 50 MG EC tablet Take 1 tablet by mouth twice daily   Glucose Blood (BLOOD GLUCOSE TEST STRIPS) STRP 1 each by In  Vitro route daily as needed for up to 60 doses. May substitute to any manufacturer covered by patient's insurance.   Lancet Device MISC 1 each by Does not apply route in the morning, at noon, and at bedtime. May substitute to any manufacturer covered by patient's insurance.   Lancets Misc. MISC 1 each by Does not apply route daily as needed. May substitute to any manufacturer covered by patient's insurance.   Multiple Vitamin (MULTIVITAMIN WITH MINERALS) TABS tablet Take 1 tablet by mouth daily.   Potassium 99 MG TABS Take by mouth as needed.   tirzepatide (MOUNJARO) 7.5 MG/0.5ML Pen Inject 7.5 mg into the  skin once a week.   VENTOLIN HFA 108 (90 BASE) MCG/ACT inhaler Inhale 2 puffs into the lungs every 4 (four) hours as needed for wheezing or shortness of breath.   vitamin B-12 (CYANOCOBALAMIN) 50 MCG tablet Take 50 mcg by mouth daily.   [DISCONTINUED] metFORMIN (GLUCOPHAGE-XR) 500 MG 24 hr tablet Take 1 tablet (500 mg total) by mouth daily with breakfast.   [DISCONTINUED] tirzepatide (MOUNJARO) 5 MG/0.5ML Pen Inject 5 mg into the skin once a week.   metFORMIN (GLUCOPHAGE-XR) 500 MG 24 hr tablet Take 1 tablet (500 mg total) by mouth daily with breakfast.   [DISCONTINUED] pantoprazole (PROTONIX) 40 MG tablet Take 40 mg by mouth daily.    [DISCONTINUED] tirzepatide [facility]) 5 MG/0.5ML Pen Inject 5 mg into the skin once a week.   No facility-administered encounter medications on file as of 03/29/2023.     Follow-Up   Return in about 3 weeks (around 04/19/2023) for For Weight Mangement with Dr. Rikki Spearing.Marland Kitchen She was informed of the importance of frequent follow up visits to maximize her success with intensive lifestyle modifications for her multiple health conditions.  Attestation Statement   Reviewed by clinician on day of visit: allergies, medications, problem list, medical history, surgical history, family history, social history, and previous encounter notes.     Worthy Rancher, MD

## 2023-03-29 NOTE — Assessment & Plan Note (Signed)
 I reviewed CMP from July 2024 in Care Everywhere.  Her ALT was 35 AST was 23.  She had a CT scan on June 2023 for renal stones that showed mild hepatic steatosis.  She does not drink alcohol and denies family history of liver disease.  She does not take over-the-counter supplements.   She will work on reducing saturated fats, simple and added sugars in her diet.  Losing 15% of body weight will improve condition.  She is now on [facility] which will help treat condition.  We will repeat fasting liver enzymes in April and complete first tier evaluation including hepatitis serologies and iron studies if not done in the past.

## 2023-03-29 NOTE — Assessment & Plan Note (Signed)
 Type 2 diabetes is managed with metformin 500 mg daily and Mounjaro 5 mg once a week. Her A1c improved from 8.4% in October/November to 7.4% in January, indicating better glycemic control. Discussed the importance of carbohydrate management and the role of complex carbohydrates in her diet. - Continue metformin 500 mg daily, can be taken with dinner -Increase Mounjaro to 7.5 mg once a week - Advise on carbohydrate management, focusing on complex carbohydrates such as whole grains, beans, sweet potatoes, and brown rice - Encourage portion control and balanced meals with protein and vegetables

## 2023-04-27 ENCOUNTER — Telehealth (INDEPENDENT_AMBULATORY_CARE_PROVIDER_SITE_OTHER): Admitting: Internal Medicine

## 2023-04-27 ENCOUNTER — Encounter (INDEPENDENT_AMBULATORY_CARE_PROVIDER_SITE_OTHER): Payer: Self-pay | Admitting: Internal Medicine

## 2023-04-27 DIAGNOSIS — E1169 Type 2 diabetes mellitus with other specified complication: Secondary | ICD-10-CM | POA: Diagnosis not present

## 2023-04-27 DIAGNOSIS — R200 Anesthesia of skin: Secondary | ICD-10-CM

## 2023-04-27 DIAGNOSIS — E11649 Type 2 diabetes mellitus with hypoglycemia without coma: Secondary | ICD-10-CM

## 2023-04-27 DIAGNOSIS — R202 Paresthesia of skin: Secondary | ICD-10-CM

## 2023-04-27 DIAGNOSIS — Z7985 Long-term (current) use of injectable non-insulin antidiabetic drugs: Secondary | ICD-10-CM

## 2023-04-27 DIAGNOSIS — Z7984 Long term (current) use of oral hypoglycemic drugs: Secondary | ICD-10-CM

## 2023-04-27 DIAGNOSIS — Z6841 Body Mass Index (BMI) 40.0 and over, adult: Secondary | ICD-10-CM

## 2023-04-27 HISTORY — DX: Anesthesia of skin: R20.0

## 2023-04-27 MED ORDER — TIRZEPATIDE 7.5 MG/0.5ML ~~LOC~~ SOAJ: 7.5000 mg | SUBCUTANEOUS | 0 refills | Status: DC

## 2023-04-27 NOTE — Progress Notes (Signed)
 Virtual Visit via Video Note  I connected withNAME@ on 04/27/23 at  2:20 PM EDT by a video enabled telemedicine application and verified that I am speaking with the correct person using two identifiers.  Location:  Patient: Work Provider: Office   I discussed the limitations of evaluation and management by telemedicine and the availability of in person appointments. The patient expressed understanding and agreed to proceed.    Weight Summary And Biometrics  Vitals BP: 134/80   Anthropometric Measurements Height: 5\' 6"  (1.676 m) Weight: 252 lb (114.3 kg) (weight reported from home scale) BMI (Calculated): 40.69 Weight at Last Visit: 253 lb Starting Weight: 257 lb Peak Weight: 264 lb   No data recorded  RMR: 1987  Today's Visit #: 7  Starting Date: 11/22/22   Subjective   Chief Complaint: Obesity  Vanessa George is here to discuss her progress with her obesity treatment plan. She is on the the Category 3 Plan and states she is following her eating plan approximately 60 % of the time. She states she is exercising 30 minutes 4 times per week.  Weight Progress Since Last Visit:  Since last office visit she has lost 1 pounds. She reports good adherence to reduced calorie nutritional plan. She has been working on reading food labels, not skipping meals, increasing protein intake at every meal, drinking more water, making healthier choices, reducing portion sizes, and incorporating more whole foods   She reports having 1 episode of hypoglycemia after not eating for prolonged period of time.  This was self managed with something sugary.  She has not had any other spells.  She also notes some numbness involving her fingers on the right hand this has been on and off for about a month.  She has an appointment scheduled with her PCP.  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  Patient Reported Barriers to Progress: none.   Orexigenic Control: Reports  improved problems with appetite and hunger signals.  Reports improved problems with satiety and satiation.    Pharmacotherapy for weight management: She is currently taking Monjauro with diabetes as the primary indication with adequate clinical response  and without side effects..   Assessment and Plan   Treatment Plan For Obesity:  Recommended Dietary Goals  Vanessa George is currently in the action stage of change. As such, her goal is to continue weight management plan. She has agreed to: continue current plan  Behavioral Health and Counseling  We discussed the following behavioral modification strategies today: continue to work on maintaining a reduced calorie state, getting the recommended amount of protein, incorporating whole foods, making healthy choices, staying well hydrated and practicing mindfulness when eating..  Additional education and resources provided today: None  Recommended Physical Activity Goals  Vanessa George has been advised to work up to 150 minutes of moderate intensity aerobic activity a week and strengthening exercises 2-3 times per week for cardiovascular health, weight loss maintenance and preservation of muscle mass.   She has agreed to :  Think about enjoyable ways to increase daily physical activity and overcoming barriers to exercise and Increase physical activity in their day and reduce sedentary time (increase NEAT).  Pharmacotherapy  We discussed various medication options to help Vanessa George with her weight loss efforts and we both agreed to : adequate clinical response to current dose, continue current regimen  Associated Conditions Impacted by Obesity Treatment  Morbid obesity (HCC), Starting BMI 42.63 Assessment & Plan: See obesity treatment plan  Type 2 diabetes mellitus with other specified complication, without long-term current use of insulin (HCC) Assessment & Plan: Type 2 diabetes is managed with metformin 500 mg daily and Mounjaro 5 mg once a week.  Her A1c improved from 8.4% in October/November to 7.4% in January, indicating better glycemic control. Discussed the importance of carbohydrate management and the role of complex carbohydrates in her diet. - Continue metformin 500 mg daily, can be taken with dinner -Continue Mounjaro to 7.5 mg once a week - Advise on carbohydrate management, focusing on complex carbohydrates such as whole grains, beans, sweet potatoes, and brown rice - Encourage portion control and balanced meals with protein and vegetables  Orders: -     Tirzepatide; Inject 7.5 mg into the skin once a week.  Dispense: 2 mL; Refill: 0  Hypoglycemia associated with type 2 diabetes mellitus (HCC) Assessment & Plan: Patient counseled on identification and management of hypoglycemia.  She is to avoid prolonged periods of time without food.    Numbness and tingling in right hand Assessment & Plan: Based on history, I suspect peripheral nerve entrapment like carpal tunnel syndrome.  She will be scheduling an appointment with her PCP for further evaluation.      Objective   Physical Exam:  Blood pressure 134/80, height 5\' 6"  (1.676 m), weight 252 lb (114.3 kg). Body mass index is 40.67 kg/m.  General: She is overweight, cooperative, alert, well developed, and in no acute distress. PSYCH: Has normal mood, affect and thought process.   Lungs: Normal breathing effort, no conversational dyspnea.    Diagnostic Data Reviewed:  BMET    Component Value Date/Time   NA 139 11/22/2022 1023   K 4.9 11/22/2022 1023   CL 99 11/22/2022 1023   CO2 25 11/22/2022 1023   GLUCOSE 148 (H) 11/22/2022 1023   GLUCOSE 91 01/20/2022 1244   BUN 6 11/22/2022 1023   CREATININE 1.03 (H) 11/22/2022 1023   CALCIUM 9.3 11/22/2022 1023   GFRNONAA >60 01/20/2022 1244   GFRAA >60 07/23/2019 1426   Lab Results  Component Value Date   HGBA1C 8.4 (H) 11/22/2022   Lab Results  Component Value Date   INSULIN 40.3 (H) 11/22/2022   Lab  Results  Component Value Date   TSH 1.296 04/13/2017   CBC    Component Value Date/Time   WBC 8.1 01/20/2022 1244   RBC 5.49 (H) 01/20/2022 1244   HGB 14.5 01/20/2022 1244   HCT 46.4 (H) 01/20/2022 1244   PLT 230 01/20/2022 1244   MCV 84.5 01/20/2022 1244   MCH 26.4 01/20/2022 1244   MCHC 31.3 01/20/2022 1244   RDW 15.2 01/20/2022 1244   Iron Studies    Component Value Date/Time   FERRITIN 319 (H) 03/19/2019 0835   Lipid Panel     Component Value Date/Time   TRIG 256 (H) 03/19/2019 0835   Hepatic Function Panel     Component Value Date/Time   PROT 7.0 11/22/2022 1023   ALBUMIN 4.0 11/22/2022 1023   AST 25 11/22/2022 1023   ALT 33 (H) 11/22/2022 1023   ALKPHOS 76 11/22/2022 1023   BILITOT 0.5 11/22/2022 1023      Component Value Date/Time   TSH 1.296 04/13/2017 1053   Nutritional Lab Results  Component Value Date   VD25OH 27.2 (L) 11/22/2022    Follow-Up   No follow-ups on file.Marland Kitchen She was informed of the importance of frequent follow up visits to maximize her success with intensive lifestyle modifications for her  multiple health conditions.  Attestation Statement   Reviewed by clinician on day of visit: allergies, medications, problem list, medical history, surgical history, family history, social history, and previous encounter notes.   I discussed the assessment and treatment plan with the patient. The patient was provided an opportunity to ask questions and all were answered. The patient agreed with the plan and demonstrated an understanding of the instructions.   The patient was advised to call back or seek an in-person evaluation if the symptoms worsen or if the condition fails to improve as anticipated.   Total visit time 22 minutes , 15 minutes were spent face-to-face on medical counseling regarding identification and management of hypoglycemia, 7 minutes spent in documentation and review of medical chart.  Worthy Rancher, MD

## 2023-04-27 NOTE — Assessment & Plan Note (Signed)
 Type 2 diabetes is managed with metformin 500 mg daily and Mounjaro 5 mg once a week. Her A1c improved from 8.4% in October/November to 7.4% in January, indicating better glycemic control. Discussed the importance of carbohydrate management and the role of complex carbohydrates in her diet. - Continue metformin 500 mg daily, can be taken with dinner -Continue Mounjaro to 7.5 mg once a week - Advise on carbohydrate management, focusing on complex carbohydrates such as whole grains, beans, sweet potatoes, and brown rice - Encourage portion control and balanced meals with protein and vegetables

## 2023-04-27 NOTE — Assessment & Plan Note (Signed)
 Patient counseled on identification and management of hypoglycemia.  She is to avoid prolonged periods of time without food.

## 2023-04-27 NOTE — Assessment & Plan Note (Signed)
 Based on history, I suspect peripheral nerve entrapment like carpal tunnel syndrome.  She will be scheduling an appointment with her PCP for further evaluation.

## 2023-04-27 NOTE — Assessment & Plan Note (Signed)
 See obesity treatment plan

## 2023-05-09 ENCOUNTER — Telehealth (INDEPENDENT_AMBULATORY_CARE_PROVIDER_SITE_OTHER): Payer: Self-pay | Admitting: Internal Medicine

## 2023-05-09 NOTE — Telephone Encounter (Signed)
 Good morning!  She says that her new dose (mounjaro 7.5) is too much for her. It's making her blood sugar so low that it's causing her to be dizzy and nearly faint. She says it happens even after she's eaten. She would like to lower the dose (to the 5). She said it has ben a consistent issue since she started the dose.   Thanks!

## 2023-05-10 ENCOUNTER — Other Ambulatory Visit (INDEPENDENT_AMBULATORY_CARE_PROVIDER_SITE_OTHER): Payer: Self-pay

## 2023-05-10 DIAGNOSIS — E1169 Type 2 diabetes mellitus with other specified complication: Secondary | ICD-10-CM

## 2023-05-10 MED ORDER — TIRZEPATIDE 5 MG/0.5ML ~~LOC~~ SOAJ: 5.0000 mg | SUBCUTANEOUS | 0 refills | Status: AC

## 2023-05-18 ENCOUNTER — Ambulatory Visit: Payer: 59 | Admitting: Orthopedic Surgery

## 2023-05-29 ENCOUNTER — Encounter (HOSPITAL_COMMUNITY): Payer: Self-pay

## 2023-05-29 ENCOUNTER — Emergency Department (HOSPITAL_COMMUNITY)

## 2023-05-29 ENCOUNTER — Other Ambulatory Visit: Payer: Self-pay

## 2023-05-29 ENCOUNTER — Emergency Department (HOSPITAL_COMMUNITY)
Admission: EM | Admit: 2023-05-29 | Discharge: 2023-05-29 | Disposition: A | Discharge status: Home / Self Care | Attending: Emergency Medicine | Admitting: Emergency Medicine

## 2023-05-29 DIAGNOSIS — E119 Type 2 diabetes mellitus without complications: Secondary | ICD-10-CM | POA: Diagnosis not present

## 2023-05-29 DIAGNOSIS — M545 Low back pain, unspecified: Secondary | ICD-10-CM | POA: Diagnosis present

## 2023-05-29 DIAGNOSIS — Z79899 Other long term (current) drug therapy: Secondary | ICD-10-CM | POA: Insufficient documentation

## 2023-05-29 DIAGNOSIS — M4316 Spondylolisthesis, lumbar region: Secondary | ICD-10-CM | POA: Insufficient documentation

## 2023-05-29 DIAGNOSIS — Z7984 Long term (current) use of oral hypoglycemic drugs: Secondary | ICD-10-CM | POA: Insufficient documentation

## 2023-05-29 DIAGNOSIS — I10 Essential (primary) hypertension: Secondary | ICD-10-CM | POA: Insufficient documentation

## 2023-05-29 DIAGNOSIS — Z7982 Long term (current) use of aspirin: Secondary | ICD-10-CM | POA: Diagnosis not present

## 2023-05-29 MED ORDER — LIDOCAINE 5 % EX PTCH
1.0000 | MEDICATED_PATCH | CUTANEOUS | Status: DC
  Administered 2023-05-29: 1 via TRANSDERMAL
  Filled 2023-05-29: qty 1

## 2023-05-29 MED ORDER — METHOCARBAMOL 500 MG PO TABS: 500.0000 mg | ORAL_TABLET | Freq: Two times a day (BID) | ORAL | 0 refills | Status: AC

## 2023-05-29 MED ORDER — METHOCARBAMOL 500 MG PO TABS
500.0000 mg | ORAL_TABLET | Freq: Once | ORAL | Status: AC
  Administered 2023-05-29: 500 mg via ORAL
  Filled 2023-05-29: qty 1

## 2023-05-29 MED ORDER — KETOROLAC TROMETHAMINE 15 MG/ML IJ SOLN
15.0000 mg | Freq: Once | INTRAMUSCULAR | Status: AC
  Administered 2023-05-29: 15 mg via INTRAMUSCULAR
  Filled 2023-05-29: qty 1

## 2023-05-29 NOTE — ED Triage Notes (Signed)
 Pt arrived via POV c/o lower back pain that Pt reports shoots down her leg. Pt reports speaking with her PCP last week about this and was prescribed prednisolone and aleve . Pt reports exerting herself this past weekend at an event and was moving lots of water around.

## 2023-05-29 NOTE — Discharge Instructions (Addendum)
 As we discussed, x-ray imaging of your back did show a slight slip forward of one of your vertebrae which is likely the cause of your back pain. This is very mild in nature and should resolve on its own without surgical intervention. There is been a lot of research on back pain, unfortunately the only thing that seems to really help is Tylenol  and ibuprofen .  Relative rest is also important to not lift greater than 10 pounds bending or twisting at the waist.  Please follow-up with your family physician.  The other thing that really seems to benefit patients is physical therapy which your doctor may send you for.  Please return to the emergency department for new numbness or weakness to your arms or legs. Difficulty with urinating or urinating or pooping on yourself.  Also if you cannot feel toilet paper when you wipe or get a fever.   Take 4 over the counter ibuprofen  tablets 3 times a day or 2 over-the-counter naproxen  tablets twice a day for pain. Also take tylenol  1000mg (2 extra strength) four times a day.   Additionally, I have given you a prescription for Robaxin which is a muscle relaxer you can take as prescribed as needed for muscle tightness and soreness. Do not drive or operate heavy machinery while taking this medication as it can be sedating.

## 2023-05-29 NOTE — ED Provider Notes (Signed)
 [place] EMERGENCY DEPARTMENT AT [facility] Provider Note   CSN: [id number] Arrival date & time: 05/29/23  1402     History  Chief Complaint  Patient presents with   Back Pain    Vanessa George is a 53 y.o. female.  Patient with history of diabetes and hypertension presents today with complaints of back pain. She states that same began initially 3 weeks ago without any known cause or trauma. She states that the pain is only present when she gets up and moves around, and is relieved with laying still with a heating pad. She does note that she had a herniated disc years ago, but this feels different for her. She went to her pcp 2 weeks ago and was told to take aleve  which she has been doing with some relief. States that her pain did not completely resolve, and therefore returned to her pcp and was prescribed prednisone. She has 1 day left of this and feels like it may have improved her symptoms some but she is not sure. She also notes that she has been applying "horse gel" to her back which does give her relief. However, last night she notes that when she went to sit on the toilet to use the bathroom, she felt like her back "seized up" and she had to sit for some time to get it to relax. She states that she used the horse gel, massage, and a heating pad and was able to get it to relax. Does note that she did more activity over the weekend than she had been doing previously. The pain does radiate down both her legs at times, but is worse on the right side, and sometimes she feels like the pain is isolated to her right hip.  She denies any sharp shooting pain pain past her her upper thighs, loss of bowel or bladder function or saddle paraesthesias. No fevers or chills. No numbness or tingling in her extremities. No calf pain. No chest pain, shortness of breath, abdominal pain, nausea, vomiting, or diarrhea. No urinary symptoms.   The history is provided by the patient. No language interpreter  was used.  Back Pain      Home Medications Prior to Admission medications   Medication Sig Start Date End Date Taking? Authorizing Provider  amLODipine -olmesartan (AZOR) 5-40 MG tablet Take 1 tablet by mouth daily. 11/10/20   [provider]  aspirin 81 MG chewable tablet Chew 81 mg by mouth daily.     [provider]  atorvastatin (LIPITOR) 10 MG tablet Take 10 mg by mouth at bedtime. 02/01/21   [provider]  Blood Glucose Monitoring Suppl DEVI 1 each by Does not apply route in the morning, at noon, and at bedtime. May substitute to any manufacturer covered by patient's insurance. 03/01/23   Ladd Picker, MD  Cholecalciferol (VITAMIN D3) 50 MCG (2000 UT) capsule Take 1 capsule (2,000 Units total) by mouth daily. 12/07/22   Ladd Picker, MD  diclofenac  (VOLTAREN ) 50 MG EC tablet Take 1 tablet by mouth twice daily 10/18/22   Tonita Frater, MD  Glucose Blood (BLOOD GLUCOSE TEST STRIPS) STRP 1 each by In Vitro route daily as needed for up to 60 doses. May substitute to any manufacturer covered by patient's insurance. 03/01/23   Ladd Picker, MD  metFORMIN  (GLUCOPHAGE -XR) 500 MG 24 hr tablet Take 1 tablet (500 mg total) by mouth daily with breakfast. 03/29/23   Ladd Picker, MD  Multiple Vitamin (MULTIVITAMIN WITH MINERALS)  TABS tablet Take 1 tablet by mouth daily.    [provider]  Potassium 99 MG TABS Take by mouth as needed.    [provider]  tirzepatide Florence Hunt) 5 MG/0.5ML Pen Inject 5 mg into the skin once a week. 05/10/23   Ladd Picker, MD  VENTOLIN  HFA 108 (90 BASE) MCG/ACT inhaler Inhale 2 puffs into the lungs every 4 (four) hours as needed for wheezing or shortness of breath. 02/27/13   [provider]  vitamin B-12 (CYANOCOBALAMIN ) 50 MCG tablet Take 50 mcg by mouth daily.    [provider]      Allergies    Vicodin [hydrocodone-acetaminophen ]    Review of Systems   Review of Systems   Musculoskeletal:  Positive for back pain.  All other systems reviewed and are negative.   Physical Exam Updated Vital Signs BP (!) 158/72   Pulse 83   Temp 98.4 F (36.9 C)   Resp 18   Ht 5\' 6"  (1.676 m)   Wt 115 kg   SpO2 94%   BMI 40.92 kg/m  Physical Exam Vitals and nursing note reviewed.  Constitutional:      General: She is not in acute distress.    Appearance: Normal appearance. She is normal weight. She is not ill-appearing, toxic-appearing or diaphoretic.  HENT:     Head: Normocephalic and atraumatic.  Cardiovascular:     Rate and Rhythm: Normal rate.  Pulmonary:     Effort: Pulmonary effort is normal. No respiratory distress.  Abdominal:     General: Abdomen is flat.     Palpations: Abdomen is soft.     Tenderness: There is no abdominal tenderness.  Musculoskeletal:        General: Normal range of motion.     Cervical back: Normal range of motion.     Comments: No reproducible tenderness to palpation over the cervical, thoracic, or lumbar spine. No stepoffs, lesions, deformity, or overlying skin changes.   No tenderness to palpation of the right hip.  5/5 strength and sensation intact to bilateral lower extremities.  Patient observed to be ambulatory with slow but steady gait without assistance  Skin:    General: Skin is warm and dry.  Neurological:     General: No focal deficit present.     Mental Status: She is alert.  Psychiatric:        Mood and Affect: Mood normal.        Behavior: Behavior normal.     ED Results / Procedures / Treatments   Labs (all labs ordered are listed, but only abnormal results are displayed) Labs Reviewed - No data to display  EKG None  Radiology DG Hip Unilat W or Wo Pelvis 2-3 Views Right Result Date: 05/29/2023 CLINICAL DATA:  Hip pain EXAM: DG HIP (WITH OR WITHOUT PELVIS) 2-3V RIGHT COMPARISON:  MRI pelvis 12/02/2021 FINDINGS: Bone island in the right proximal femoral metadiaphysis. Preserved articular cartilage  thickness in the hips. No fracture or acute bony findings. IMPRESSION: 1. No acute findings. 2. Bone island in the right proximal femoral metadiaphysis. Electronically Signed   By: Freida Jes M.D.   On: 05/29/2023 16:27   DG Lumbar Spine Complete Result Date: 05/29/2023 CLINICAL DATA:  Back pain EXAM: LUMBAR SPINE - COMPLETE 4+ VIEW COMPARISON:  04/28/2014 FINDINGS: 4 mm grade 1 degenerative anterolisthesis at L4-5 with associated degenerative facet arthropathy. No pars defects identified. No fracture or acute bony findings. IMPRESSION: 1. 4 mm grade 1 degenerative anterolisthesis  at L4-5 with associated degenerative facet arthropathy. Electronically Signed   By: Freida Jes M.D.   On: 05/29/2023 16:25    Procedures Procedures    Medications Ordered in ED Medications  lidocaine (LIDODERM) 5 % 1 patch (1 patch Transdermal Patch Applied 05/29/23 1612)  ketorolac  (TORADOL ) 15 MG/ML injection 15 mg (15 mg Intramuscular Given 05/29/23 1612)  methocarbamol (ROBAXIN) tablet 500 mg (500 mg Oral Given 05/29/23 1612)    ED Course/ Medical Decision Making/ A&P                                 Medical Decision Making Amount and/or Complexity of Data Reviewed Radiology: ordered.  Risk Prescription drug management.   This patient is a 53 y.o. female  who presents to the ED for concern of back pain.   Differential diagnoses prior to evaluation: The emergent differential diagnosis includes, but is not limited to,  Fracture (acute/chronic), muscle strain, cauda equina, spinal stenosis, DDD, metastatic cancer, vertebral osteomyelitis, kidney stone, pyelonephritis, AAA, pancreatitis, bowel obstruction, meningitis.  This is not an exhaustive differential.   Past Medical History / Co-morbidities / Social History:  has a past medical history of Acid reflux, Anemia, Anxiety, Asthma, Depression, Diabetes mellitus without complication (HCC), Edema of both lower extremities, Essential hypertension,  High cholesterol, and SOB (shortness of breath).  Additional history: Chart reviewed. Pertinent results include: unable to review pcp notes  Physical Exam: Physical exam performed. The pertinent findings include: no reproducible pain.  Good distal pulses, strength, and sensation.  Observed to be ambulatory with steady gait.  Lab Tests/Imaging studies: I personally interpreted imaging and the pertinent results include:  DG right hip and lumbar spine  Lumbar spine 1. 4 mm grade 1 degenerative anterolisthesis at L4-5 with associated degenerative facet arthropathy.  Right hip 1. No acute findings. 2. Bone island in the right proximal femoral metadiaphysis.  I agree with the radiologist interpretation.   Medications: I ordered medication including robaxin, toradol , lidocaine patches.  I have reviewed the patients home medicines and have made adjustments as needed.   Disposition: After consideration of the diagnostic results and the patients response to treatment, I feel that emergency department workup does not suggest an emergent condition requiring admission or immediate intervention beyond what has been performed at this time. The plan is: Discharge with Robaxin, close outpatient follow-up, and return precautions. Patients x-ray shows anterolisthesis, likely the cause of her discomfort.  After above intervention, patient is feeling better and ready to go home.  Recommend rest, NSAIDs, and lifting precautions. She has no urinary symptoms to suggest kidney stone or pyelonephritis.  She has no red flag symptoms to suggest cauda equina.  No indication for further evaluation with MRI imaging at this time.  Doubt AAA. Evaluation and diagnostic testing in the emergency department does not suggest an emergent condition requiring admission or immediate intervention beyond what has been performed at this time.  Plan for discharge with close PCP follow-up.  Patient is understanding and amenable with plan,  educated on red flag symptoms that would prompt immediate return.  Patient discharged in stable condition.  Final Clinical Impression(s) / ED Diagnoses Final diagnoses:  Anterolisthesis of lumbar spine    Rx / DC Orders ED Discharge Orders          Ordered    methocarbamol (ROBAXIN) 500 MG tablet  2 times daily        05/29/23 1645  An After Visit Summary was printed and given to the patient.     Garion Wempe A, PA-C 05/29/23 1655    Iva Mariner, MD 05/30/23 [phone]

## 2023-06-29 ENCOUNTER — Ambulatory Visit (INDEPENDENT_AMBULATORY_CARE_PROVIDER_SITE_OTHER): Admitting: Internal Medicine

## 2023-07-11 LAB — COLOGUARD: COLOGUARD: NEGATIVE

## 2023-07-12 ENCOUNTER — Emergency Department (HOSPITAL_COMMUNITY)
Admission: EM | Admit: 2023-07-12 | Discharge: 2023-07-12 | Disposition: A | Discharge status: Home / Self Care | Attending: Emergency Medicine | Admitting: Emergency Medicine

## 2023-07-12 ENCOUNTER — Encounter (HOSPITAL_COMMUNITY): Payer: Self-pay | Admitting: Emergency Medicine

## 2023-07-12 ENCOUNTER — Emergency Department (HOSPITAL_COMMUNITY)

## 2023-07-12 DIAGNOSIS — J45909 Unspecified asthma, uncomplicated: Secondary | ICD-10-CM | POA: Diagnosis not present

## 2023-07-12 DIAGNOSIS — E119 Type 2 diabetes mellitus without complications: Secondary | ICD-10-CM | POA: Insufficient documentation

## 2023-07-12 DIAGNOSIS — Z7982 Long term (current) use of aspirin: Secondary | ICD-10-CM | POA: Insufficient documentation

## 2023-07-12 DIAGNOSIS — Z7984 Long term (current) use of oral hypoglycemic drugs: Secondary | ICD-10-CM | POA: Diagnosis not present

## 2023-07-12 DIAGNOSIS — Z79899 Other long term (current) drug therapy: Secondary | ICD-10-CM | POA: Insufficient documentation

## 2023-07-12 DIAGNOSIS — D649 Anemia, unspecified: Secondary | ICD-10-CM | POA: Diagnosis not present

## 2023-07-12 DIAGNOSIS — I10 Essential (primary) hypertension: Secondary | ICD-10-CM | POA: Diagnosis not present

## 2023-07-12 DIAGNOSIS — R42 Dizziness and giddiness: Secondary | ICD-10-CM | POA: Insufficient documentation

## 2023-07-12 LAB — BASIC METABOLIC PANEL WITH GFR
Anion gap: 13 (ref 5–15)
BUN: 7 mg/dL (ref 6–20)
CO2: 20 mmol/L — ABNORMAL LOW (ref 22–32)
Calcium: 9 mg/dL (ref 8.9–10.3)
Chloride: 102 mmol/L (ref 98–111)
Creatinine, Ser: 1.02 mg/dL — ABNORMAL HIGH (ref 0.44–1.00)
GFR, Estimated: >60 mL/min (ref >60)
Glucose, Bld: 219 mg/dL — ABNORMAL HIGH (ref 70–99)
Potassium: 3.7 mmol/L (ref 3.5–5.1)
Sodium: 135 mmol/L (ref 135–145)

## 2023-07-12 LAB — CBC
HCT: 47.2 % — ABNORMAL HIGH (ref 36.0–46.0)
Hemoglobin: 14.6 g/dL (ref 12.0–15.0)
MCH: 26.5 pg (ref 26.0–34.0)
MCHC: 30.9 g/dL (ref 30.0–36.0)
MCV: 85.8 fL (ref 80.0–100.0)
Platelets: 204 10*3/uL (ref 150–400)
RBC: 5.5 MIL/uL — ABNORMAL HIGH (ref 3.87–5.11)
RDW: 15.4 % (ref 11.5–15.5)
WBC: 8.7 10*3/uL (ref 4.0–10.5)
nRBC: 0 % (ref 0.0–0.2)

## 2023-07-12 LAB — TROPONIN I (HIGH SENSITIVITY)
Troponin I (High Sensitivity): <2 ng/L (ref <18)
Troponin I (High Sensitivity): 5 ng/L (ref <18)

## 2023-07-12 LAB — CBG MONITORING, ED: Glucose-Capillary: 202 mg/dL — ABNORMAL HIGH (ref 70–99)

## 2023-07-12 NOTE — ED Provider Triage Note (Signed)
 Emergency Medicine Provider Triage Evaluation Note  Azlynn Mitnick , a 53 y.o. female  was evaluated in triage.  Pt complains of recurrent syncope, ongoing dizziness and nausea.  She notes that she has had recent changes in medication including increase in Mounjaro , change in antihypertensives, but this was 3 weeks ago.  Today, before going to work she felt lightheaded, has had multiple episodes of near syncope, no chest pain..  Review of Systems  Positive: Near syncope Negative: Fall  Physical Exam  BP (!) 180/91   Pulse (!) 102   Temp 98.4 F (36.9 C)   Resp 18   SpO2 98%  Gen:   Awake, no distress adult female speaking clearly Resp:  Normal effort no audible wheezing MSK:   Moves extremities without difficulty no deformity Other:  Neuro intact  Medical Decision Making  Medically screening exam initiated at 1:34 PM.  Appropriate orders placed.  Analisia Kingsford was informed that the remainder of the evaluation will be completed by another provider, this initial triage assessment does not replace that evaluation, and the importance of remaining in the ED until their evaluation is complete.   Dorenda Gandy, MD 07/12/23 1335

## 2023-07-12 NOTE — ED Provider Notes (Signed)
 [place] EMERGENCY DEPARTMENT AT [facility] Provider Note   CSN: [id number] Arrival date & time: 07/12/23  1256     Patient presents with: Dizziness   Vanessa George is a 53 y.o. female with past medical history of diabetes, HTN, BMI 42, HLD, anemia, asthma presents emergency department for evaluation of presyncope.  Today, she reports that she left her house without eating breakfast nor taking her medications this morning.  She started feeling woozy, lightheaded, presyncopal.  Following this, she had cupcakes, eggs, bacon, biscuit with some improvement to symptoms.  She then attempted to take meclizine  as she has a history of BPPV without much relief.  Normally takes meclizine  as needed and has not had an episode of BPPV for a while.  She then sought ED evaluation via EMS following no improvement of symptoms.  Has not been drinking water as adequately as she should. No dizziness, visual disturbances. Denies urinary symptoms, NVD, abdominal pain, chest pain, shortness of breath, fevers, cough, recent falls, numbness, paresthesia, nor weakness.  Of note, PCP recently changed her medications from amlodipine  to them losartan 40 mg, stopped her metformin , and increased her Mounjaro  to 7.5 mg 2-3 weeks ago.  She reports that when she increased her Mounjaro  from 5-7.5 in the past she noticed some lightheadedness which is why she decreased her dose back down to 5.    Dizziness      Prior to Admission medications   Medication Sig Start Date End Date Taking? Authorizing Provider  amLODipine -olmesartan (AZOR) 5-40 MG tablet Take 1 tablet by mouth daily. 11/10/20   [provider]  aspirin 81 MG chewable tablet Chew 81 mg by mouth daily.     [provider]  atorvastatin (LIPITOR) 10 MG tablet Take 10 mg by mouth at bedtime. 02/01/21   [provider]  Blood Glucose Monitoring Suppl DEVI 1 each by Does not apply route in the morning, at noon, and at bedtime. May  substitute to any manufacturer covered by patient's insurance. 03/01/23   Ladd Picker, MD  Cholecalciferol (VITAMIN D3) 50 MCG (2000 UT) capsule Take 1 capsule (2,000 Units total) by mouth daily. 12/07/22   Ladd Picker, MD  diclofenac  (VOLTAREN ) 50 MG EC tablet Take 1 tablet by mouth twice daily 10/18/22   Tonita Frater, MD  Glucose Blood (BLOOD GLUCOSE TEST STRIPS) STRP 1 each by In Vitro route daily as needed for up to 60 doses. May substitute to any manufacturer covered by patient's insurance. 03/01/23   Ladd Picker, MD  metFORMIN  (GLUCOPHAGE -XR) 500 MG 24 hr tablet Take 1 tablet (500 mg total) by mouth daily with breakfast. 03/29/23   Ladd Picker, MD  methocarbamol  (ROBAXIN ) 500 MG tablet Take 1 tablet (500 mg total) by mouth 2 (two) times daily. 05/29/23   Smoot, Genevive Ket, PA-C  Multiple Vitamin (MULTIVITAMIN WITH MINERALS) TABS tablet Take 1 tablet by mouth daily.    [provider]  Potassium 99 MG TABS Take by mouth as needed.    [provider]  tirzepatide  (MOUNJARO ) 5 MG/0.5ML Pen Inject 5 mg into the skin once a week. 05/10/23   Ladd Picker, MD  VENTOLIN  HFA 108 (90 BASE) MCG/ACT inhaler Inhale 2 puffs into the lungs every 4 (four) hours as needed for wheezing or shortness of breath. 02/27/13   [provider]  vitamin B-12 (CYANOCOBALAMIN ) 50 MCG tablet Take 50 mcg by mouth daily.    [provider]    Allergies: Vicodin [hydrocodone-acetaminophen ]  Review of Systems  Neurological:  Positive for dizziness.    Updated Vital Signs BP 138/88   Pulse 85   Temp 98.5 F (36.9 C)   Resp 18   SpO2 100%   Physical Exam Vitals and nursing note reviewed.  Constitutional:      General: She is not in acute distress.    Appearance: Normal appearance.  HENT:     Head: Normocephalic and atraumatic.   Eyes:     General: Lids are normal. Vision grossly intact. No visual field deficit.    Extraocular Movements:     Right  eye: Normal extraocular motion and no nystagmus.     Left eye: Normal extraocular motion and no nystagmus.     Conjunctiva/sclera: Conjunctivae normal.    Cardiovascular:     Rate and Rhythm: Normal rate.  Pulmonary:     Effort: Pulmonary effort is normal. No respiratory distress.     Breath sounds: Normal breath sounds.     Comments: Speaking in full complete sentences without difficulty Abdominal:     General: There is no distension.     Palpations: Abdomen is soft.     Tenderness: There is no abdominal tenderness. There is no right CVA tenderness, left CVA tenderness, guarding or rebound.     Comments: Nonsurgical abdomen with no peritoneal signs   Musculoskeletal:     Cervical back: Normal range of motion and neck supple. No rigidity.     Right lower leg: No edema.     Left lower leg: No edema.     Comments: No pedal edema nor TTP.  Celine Collard' sign negative x 2   Skin:    General: Skin is warm.     Capillary Refill: Capillary refill takes less than 2 seconds.     Coloration: Skin is not jaundiced or pale.   Neurological:     Mental Status: She is alert and oriented to person, place, and time. Mental status is at baseline.     GCS: GCS eye subscore is 4. GCS verbal subscore is 5. GCS motor subscore is 6.     Cranial Nerves: No cranial nerve deficit, dysarthria or facial asymmetry.     Sensory: Sensation is intact. No sensory deficit.     Motor: No weakness, tremor, atrophy, abnormal muscle tone, seizure activity or pronator drift.     Coordination: Coordination normal. Finger-Nose-Finger Test and Heel to [place] Test normal.     Gait: Gait is intact. Gait normal.     Comments: No pronator drift.  Grip strength equal bilaterally.  No dizziness or lightheadedness    (all labs ordered are listed, but only abnormal results are displayed) Labs Reviewed  BASIC METABOLIC PANEL WITH GFR - Abnormal; Notable for the following components:      Result Value   CO2 20 (*)    Glucose, Bld  219 (*)    Creatinine, Ser 1.02 (*)    All other components within normal limits  CBC - Abnormal; Notable for the following components:   RBC 5.50 (*)    HCT 47.2 (*)    All other components within normal limits  CBG MONITORING, ED - Abnormal; Notable for the following components:   Glucose-Capillary 202 (*)    All other components within normal limits  CBG MONITORING, ED  TROPONIN I (HIGH SENSITIVITY)  TROPONIN I (HIGH SENSITIVITY)    EKG: EKG Interpretation Date/Time:  Wednesday July 12 2023 13:29:48 EDT Ventricular Rate:  113 PR Interval:  166 QRS  Duration:  78 QT Interval:  330 QTC Calculation: 452 R Axis:   61  Text Interpretation: Sinus tachycardia Cannot rule out Anterior infarct , age undetermined Abnormal ECG Confirmed by Dorenda Gandy [phone]) on 07/12/2023 2:44:51 PM  Radiology: DG Chest 2 View Result Date: 07/12/2023 CLINICAL DATA:  Dizziness EXAM: CHEST - 2 VIEW COMPARISON:  05/15/2021 FINDINGS: The heart size and mediastinal contours are within normal limits. Both lungs are clear. The visualized skeletal structures are unremarkable. IMPRESSION: No active cardiopulmonary disease. Electronically Signed   By: Esmeralda Hedge M.D.   On: 07/12/2023 15:48     Procedures   Medications Ordered in the ED - No data to display                                  Medical Decision Making Amount and/or Complexity of Data Reviewed Labs: ordered. Radiology: ordered.     Patient presents to the ED for concern of lightheadedness, presyncope, this involves an extensive number of treatment options, and is a complaint that carries with it a high risk of complications and morbidity.  The differential diagnosis includes ACS, ICH, hypoglycemia, hyperglycemia, electrolyte abnormality, infection, pneumonia, AKI, UTI, dehydration, medication changes, drug toxidrome.  Not an exhaustive list   Co morbidities that complicate the patient evaluation  See HPI   Additional history  obtained:  Additional history obtained from [facility]   External records from outside source obtained and reviewed including triage RN note   Lab Tests:  I Ordered, and personally interpreted labs.  The pertinent results include:   Troponin negative x 2 CBG 219 Creatinine 1.02   Imaging Studies ordered:  I ordered imaging studies including chest x-ray I independently visualized and interpreted imaging which showed no cardiopulmonary pathology I agree with the radiologist interpretation   Cardiac Monitoring:  The patient was maintained on a cardiac monitor.  I personally viewed and interpreted the cardiac monitored which showed an underlying rhythm of: Sinus tachycardia at 113 bpm with no ST nor T wave abnormalities    Problem List / ED Course:  Lightheadedness Presyncope Neuro intact.  No complaints of dizziness, paresthesia, numbness, weakness of extremities.  Husband at bedside denies slurred speech, aphasia, altered mentation, seizure-like activity, falls In ED, presented mildly tachycardic at 103 bpm with hypertension of 180/91.  HTN likely secondary to not taking her BP medications this morning.  Blood pressure has normalized without any interventions.  Low suspicion for hypertensive urgency as patient has no complaints of visual disturbances, chest pain, shortness of breath.  No signs of EOD on lab work Neuro intact with no paresthesia, weakness, numbness.  Low suspicion of TIA/CVA.  No complaints of dizziness or visual disturbances just feeling lightheaded and it does not sound like posterior circulation is compromised. Orthostatics negative. Supine 150/84 Sitting 146/82. Standing 142/80   Reevaluation:  After the interventions noted above, I reevaluated the patient and found that they have :improved     Dispostion:  After consideration of the diagnostic results and the patients response to treatment, I feel that the patent would benefit from outpatient  management with PCP follow-up.  Discussed ED workup, disposition, return to ED precautions with patient who expresses understanding agrees with plan.  All questions answered to their satisfaction.  They are agreeable to plan.  Discharge instructions provided on paperwork  Final diagnoses:  Lightheadedness    ED Discharge Orders     None  Royann Cords, PA 07/12/23 2041    Tonya Fredrickson, MD 07/13/23 1414

## 2023-07-12 NOTE — Discharge Instructions (Signed)
 Thank you for let us  evaluate you today.  Your lab work was unremarkable.  Your troponin/heart enzymes did not show any signs of heart injury.  Your chest x-ray did not have any fluid nor pneumonia.  You do not have any gross electrolyte abnormalities.  Your vital signs are WNL.  You do not have any orthostatic vital signs.  Please follow-up with primary care provider regarding medication management and make sure to drink plenty of water, take medications as prescribed, eat meals throughout the day.  Return to Emergency Department if you experience altered mentation, numbness or weakness in one-sided body, slurred speech, seizure-like activity, dizziness at rest that does not worsen with movement

## 2023-07-12 NOTE — ED Triage Notes (Signed)
 Pt here from work with c/o dizziness and vertigo type symptoms along with htn , pt did forgot to take her meds this morning,he md has stopped her metformin  and increased her manjaro

## 2023-07-12 NOTE — ED Notes (Addendum)
 Patient ambulatory to RR. Roomed from [facility].

## 2023-07-21 ENCOUNTER — Other Ambulatory Visit: Payer: Self-pay

## 2023-07-21 ENCOUNTER — Encounter (HOSPITAL_COMMUNITY): Payer: Self-pay

## 2023-07-21 ENCOUNTER — Ambulatory Visit (HOSPITAL_COMMUNITY): Attending: Family Medicine

## 2023-07-21 DIAGNOSIS — M6281 Muscle weakness (generalized): Secondary | ICD-10-CM | POA: Insufficient documentation

## 2023-07-21 DIAGNOSIS — M5116 Intervertebral disc disorders with radiculopathy, lumbar region: Secondary | ICD-10-CM | POA: Insufficient documentation
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
## 2023-08-10 ENCOUNTER — Ambulatory Visit (HOSPITAL_COMMUNITY)

## 2023-08-16 ENCOUNTER — Encounter (HOSPITAL_COMMUNITY)

## 2023-08-22 ENCOUNTER — Encounter (HOSPITAL_COMMUNITY): Payer: Self-pay

## 2023-08-22 ENCOUNTER — Ambulatory Visit (HOSPITAL_COMMUNITY): Attending: Family Medicine

## 2023-08-22 DIAGNOSIS — M6281 Muscle weakness (generalized): Secondary | ICD-10-CM | POA: Diagnosis present

## 2023-08-22 DIAGNOSIS — M5116 Intervertebral disc disorders with radiculopathy, lumbar region: Secondary | ICD-10-CM | POA: Insufficient documentation
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
## 2023-08-23 NOTE — Addendum Note (Signed)
 Addended by: Clee Pandit on: 08/23/2023 02:25 PM   Modules accepted: Orders

## 2023-08-24 ENCOUNTER — Encounter (HOSPITAL_COMMUNITY)

## 2023-08-29 ENCOUNTER — Encounter (HOSPITAL_COMMUNITY): Admitting: Physical Therapy

## 2023-08-31 ENCOUNTER — Encounter (HOSPITAL_COMMUNITY)

## 2023-09-01 ENCOUNTER — Encounter (HOSPITAL_COMMUNITY): Payer: Self-pay

## 2023-09-01 NOTE — Therapy (Signed)
 [facility] Out[facility] Rehabilitation at [facility] [address] [place], [place], [address] Phone: [phone]   Fax:  [phone]  Patient Details  Name: Vanessa George MRN: [id number] Date of Birth: 1971/01/17 Referring Provider:  No ref. provider found  Encounter Date: 09/01/2023  Pt was called concerning her missed appointment yesterday afternoon. Pt did not answer but was left voicemail with instructions to call back to discuss POC.  Lang Ada, PT, DPT [facility] Office: [phone] 8:22 AM, 09/01/23   [facility] at [facility] [address] [place], [place], [address] Phone: [phone]   Fax:  [phone]

## 2023-09-04 ENCOUNTER — Telehealth (HOSPITAL_COMMUNITY): Payer: Self-pay

## 2023-09-04 ENCOUNTER — Encounter (HOSPITAL_COMMUNITY)

## 2023-09-04 NOTE — Telephone Encounter (Signed)
 No Show #2 Called patient concerning missed appointment this date. No answer but left voicemail explaining No Show policy. Reminded of next scheduled appointment on 09/06/23. Patient only with one remaining appoint.    5:46 PM, 09/04/23 Rosaria Settler, PT, DPT [facility] [place]

## 2023-09-06 ENCOUNTER — Encounter (HOSPITAL_COMMUNITY)

## 2023-09-06 ENCOUNTER — Telehealth (HOSPITAL_COMMUNITY): Payer: Self-pay

## 2023-09-06 NOTE — Telephone Encounter (Signed)
 Called patient this date concerning cancelled appointment, as this was patient's last scheduled appointment. Patient reports first two No Shows were family emergencies. Reports she would like to continue therapy. Explained to pt she would have to schedule appointments one at a time per No Show policy. Call transferred to front desk staff for reschedule.   4:14 PM, 09/06/23 Vanessa George, PT, DPT [facility] [place]

## 2023-09-13 ENCOUNTER — Other Ambulatory Visit (INDEPENDENT_AMBULATORY_CARE_PROVIDER_SITE_OTHER): Payer: Self-pay | Admitting: Internal Medicine

## 2023-09-13 DIAGNOSIS — E1169 Type 2 diabetes mellitus with other specified complication: Secondary | ICD-10-CM

## 2023-10-02 ENCOUNTER — Encounter: Payer: Self-pay | Admitting: Obstetrics & Gynecology

## 2023-10-02 ENCOUNTER — Ambulatory Visit (INDEPENDENT_AMBULATORY_CARE_PROVIDER_SITE_OTHER): Admitting: Obstetrics & Gynecology

## 2023-10-02 VITALS — BP 140/82 | Ht 66.0 in | Wt 262.0 lb

## 2023-10-02 DIAGNOSIS — N951 Menopausal and female climacteric states: Secondary | ICD-10-CM

## 2023-10-02 DIAGNOSIS — Z01419 Encounter for gynecological examination (general) (routine) without abnormal findings: Secondary | ICD-10-CM | POA: Diagnosis not present

## 2023-10-02 DIAGNOSIS — N83201 Unspecified ovarian cyst, right side: Secondary | ICD-10-CM | POA: Diagnosis not present

## 2023-10-02 DIAGNOSIS — Z6841 Body Mass Index (BMI) 40.0 and over, adult: Secondary | ICD-10-CM

## 2023-10-02 NOTE — Progress Notes (Signed)
 WELL-WOMAN EXAMINATION Patient name: Vanessa George MRN [id number]  Date of birth: [birth year] Chief Complaint:   Annual Exam  History of Present Illness:   Vanessa George is a 53 y.o. G4P0040 PM female being seen today for a routine well-woman exam.   Notes vaginal bleeding this Aug x 2 days.   On occasion will have slight pink bleeding every now then, not every month.  Patient has not gone a full year without a menses.  Typically every several months.  Denies postcoital bleeding.  Patient with history of right 3 cm ovarian thecoma. MRI completed 11/2021  Notes back pain and mostly right-leg pain.  Has slipped disc L4-5 - currently in physical therapy  Patient's last menstrual period was 09/20/2023 (exact date).  The current method of family planning is perimenopausal.    Last pap 2023.  Last mammogram: 10/2022. Last colonoscopy: 06/2023     09/22/2022    2:13 PM 08/17/2021    3:13 PM  Depression screen PHQ 2/9  Decreased Interest 0 0  Down, Depressed, Hopeless 0 0  PHQ - 2 Score 0 0  Altered sleeping 1 0  Tired, decreased energy 1 0  Change in appetite 0 0  Feeling bad or failure about yourself  0 0  Trouble concentrating 0 0  Moving slowly or fidgety/restless 0 0  Suicidal thoughts 0 0  PHQ-9 Score 2 0      Review of Systems:   Pertinent items are noted in HPI Denies any headaches, blurred vision, fatigue, shortness of breath, chest pain, abdominal pain, bowel movements, urination, or intercourse unless otherwise stated above.  Pertinent History Reviewed:  Reviewed past medical,surgical, social and family history.  Reviewed problem list, medications and allergies. Physical Assessment:   Vitals:   10/02/23 1129  BP: (!) 140/82  Weight: 262 lb (118.8 kg)  Height: 5' 6 (1.676 m)  Body mass index is 42.29 kg/m.        Physical Examination:   General appearance - well appearing, and in no distress  Mental status - alert, oriented to person, place, and time  Psych:   She has a normal mood and affect  Skin - warm and dry, normal color, no suspicious lesions noted  Chest - effort normal, all lung fields clear to auscultation bilaterally  Heart - normal rate and regular rhythm  Neck:  midline trachea, no thyromegaly or nodules  Breasts - breasts appear normal, no suspicious masses, no skin or nipple changes or  axillary nodes  Abdomen - obese, soft, nontender, nondistended, no masses or organomegaly  Pelvic - VULVA: normal appearing vulva with no masses, tenderness or lesions  VAGINA: normal appearing vagina with normal color and discharge, no lesions  CERVIX: normal appearing cervix without discharge or lesions, no CMT  UTERUS: uterus is felt to be normal size, shape, consistency and nontender   ADNEXA: No adnexal masses or tenderness noted- exam limited due to body habitus  Extremities:  No swelling or varicosities noted  Chaperone: Aleck Blase     Assessment & Plan:  1) Well-Woman Exam -Preventative screening up-to-date, reviewed ASCCP guidelines  2) Perimenopausal - Due to age and continued AUB, plan for pelvic ultrasound to evaluate endometrial lining -FSH to be collected -further management pending results of testing  3) Ovarian cyst - Will reevaluate on upcoming pelvic ultrasound   Orders Placed This Encounter  Procedures   US  PELVIC COMPLETE WITH TRANSVAGINAL   FSH    Meds: No orders of the defined types  were placed in this encounter.   Follow-up: Return for pelvic US  next available @ [facility] and visit with me to follow.   Kron Everton, DO Attending Obstetrician & Gynecologist, [facility] for [employer], [facility]

## 2023-10-03 ENCOUNTER — Ambulatory Visit: Payer: Self-pay | Admitting: Obstetrics & Gynecology

## 2023-10-03 ENCOUNTER — Ambulatory Visit (HOSPITAL_COMMUNITY): Admitting: Physical Therapy

## 2023-10-03 ENCOUNTER — Telehealth (HOSPITAL_COMMUNITY): Payer: Self-pay | Admitting: Physical Therapy

## 2023-10-03 LAB — FOLLICLE STIMULATING HORMONE: FSH: 7.2 m[IU]/mL

## 2023-10-03 NOTE — Telephone Encounter (Signed)
 PT arrived too late for appt.  Pt with history of NS/CX only completing 2 visits since evaluation on 07/21/23.  Will need to discuss and consider if therapy is appropriate when/if returns and will need to be reassessed again as she is out of certification date.    Greig KATHEE Fuse, PTA/CLT [facility] Ph: [phone]

## 2023-10-25 ENCOUNTER — Ambulatory Visit (HOSPITAL_COMMUNITY): Attending: Family Medicine | Admitting: Physical Therapy

## 2023-10-25 DIAGNOSIS — M6281 Muscle weakness (generalized): Secondary | ICD-10-CM | POA: Insufficient documentation

## 2023-10-25 DIAGNOSIS — M5116 Intervertebral disc disorders with radiculopathy, lumbar region: Secondary | ICD-10-CM | POA: Insufficient documentation

## 2023-10-25 NOTE — Therapy (Unsigned)
 [facility] at [facility] [address] [place], [place], [address] Phone: [phone]   Fax:  [phone]  Patient Details  Name: Vanessa George MRN: [id number] Date of Birth: [birth year] Referring Provider:  Leonce Lucie PARAS, PA*  Encounter Date: 10/25/2023  Pt came to appointment but was unable to be treated due to time since last treatment, prior approval needed from insurance and new order needed from referring MD.  Pt instructed to contact MD to send new order and would get her scheduled.  Instructed to continue completing therex given at last appointment.    Greig KATHEE Fuse, PTA/CLT [facility] Ph: [phone] 3:24 PM, 10/25/23   [facility] Rehabilitation at [facility] [address] [place], [place], [address] Phone: [phone]   Fax:  [phone]

## 2023-10-31 ENCOUNTER — Ambulatory Visit: Admitting: Obstetrics & Gynecology

## 2023-10-31 ENCOUNTER — Other Ambulatory Visit: Admitting: Radiology

## 2023-11-07 ENCOUNTER — Other Ambulatory Visit (HOSPITAL_COMMUNITY): Payer: Self-pay | Admitting: Family Medicine

## 2023-11-07 DIAGNOSIS — Z1231 Encounter for screening mammogram for malignant neoplasm of breast: Secondary | ICD-10-CM

## 2023-11-13 ENCOUNTER — Ambulatory Visit (HOSPITAL_COMMUNITY)
Admission: RE | Admit: 2023-11-13 | Discharge: 2023-11-13 | Disposition: A | Discharge status: Home / Self Care | Source: Ambulatory Visit

## 2023-11-13 DIAGNOSIS — Z1231 Encounter for screening mammogram for malignant neoplasm of breast: Secondary | ICD-10-CM | POA: Insufficient documentation

## 2023-11-16 ENCOUNTER — Encounter: Payer: Self-pay | Admitting: Obstetrics & Gynecology

## 2023-11-16 ENCOUNTER — Ambulatory Visit: Admitting: Obstetrics & Gynecology

## 2023-11-16 ENCOUNTER — Ambulatory Visit

## 2023-11-16 ENCOUNTER — Other Ambulatory Visit (HOSPITAL_COMMUNITY)
Admission: RE | Admit: 2023-11-16 | Discharge: 2023-11-16 | Disposition: A | Discharge status: Home / Self Care | Source: Ambulatory Visit | Attending: Obstetrics & Gynecology | Admitting: Obstetrics & Gynecology

## 2023-11-16 VITALS — BP 139/87 | HR 75 | Ht 66.0 in | Wt 264.2 lb

## 2023-11-16 DIAGNOSIS — D259 Leiomyoma of uterus, unspecified: Secondary | ICD-10-CM

## 2023-11-16 DIAGNOSIS — N950 Postmenopausal bleeding: Secondary | ICD-10-CM

## 2023-11-16 DIAGNOSIS — R9389 Abnormal findings on diagnostic imaging of other specified body structures: Secondary | ICD-10-CM | POA: Diagnosis not present

## 2023-11-16 DIAGNOSIS — Z6841 Body Mass Index (BMI) 40.0 and over, adult: Secondary | ICD-10-CM

## 2023-11-16 DIAGNOSIS — R3989 Other symptoms and signs involving the genitourinary system: Secondary | ICD-10-CM

## 2023-11-16 DIAGNOSIS — N83201 Unspecified ovarian cyst, right side: Secondary | ICD-10-CM

## 2023-11-16 DIAGNOSIS — N8500 Endometrial hyperplasia, unspecified: Secondary | ICD-10-CM | POA: Diagnosis not present

## 2023-11-16 DIAGNOSIS — R350 Frequency of micturition: Secondary | ICD-10-CM

## 2023-11-16 DIAGNOSIS — N951 Menopausal and female climacteric states: Secondary | ICD-10-CM

## 2023-11-16 NOTE — Progress Notes (Signed)
 GYN VISIT Patient name: Vanessa George MRN [id number]  Date of birth: 11-Jul-1970 Chief Complaint:   Follow-up  History of Present Illness:   Vanessa George is a 53 y.o. G4P0040 PM female being seen today for follow up regarding:  -h/o ovarian thecoma- last imaging 11/2021: 3.2 x 2 cm right ovarian enhancing mass.  Additional 4.5 x 3.4 subserosal fibroid noted  -PMB: In Sept noted 2 days of spotting in August and on occasion may have some spotting. Today she notes another episode of bleeding Oct 12- lasted for 3 days.  BRB typically about one pad per day.  Denies pelvic or abdominal pain, no other acute gyn concerns.  US  today: PELVIC US  TA/TV: heterogeneous anteverted uterus,posterior submucosal vs subserosal fibroid 4.6 x 3.7 x 4 cm,multiple small nabothian cyst,avascular homogeneous thickened endometrium 10.7 mm,hypervascular solid echogenic right ovarian mass 3.5 x 2.6 x 2.7 cm,normal left ovary,no free fluid,no pain during ultrasound   -Patient also notes concerns for possible UTI.  She notes urinary frequency and incomplete voiding.  This has just started the past couple of days.  She denies fevers or chills.  No LMP recorded. (Menstrual status: Perimenopausal).    Review of Systems:   Pertinent items are noted in HPI Denies fever/chills, dizziness, headaches, visual disturbances, fatigue, shortness of breath, chest pain, abdominal pain, vomiting. Pertinent History Reviewed:   Past Surgical History:  Procedure Laterality Date   TUBAL LIGATION      Past Medical History:  Diagnosis Date   Acid reflux    Anemia    Anxiety    Asthma    Depression    Diabetes mellitus without complication (HCC)    Edema of both lower extremities    Essential hypertension    High cholesterol    SOB (shortness of breath)    Reviewed problem list, medications and allergies. Physical Assessment:   Vitals:   11/16/23 1015  BP: 139/87  Pulse: 75  Weight: 264 lb 3.2 oz (119.8 kg)  Height: 5' 6  (1.676 m)  Body mass index is 42.64 kg/m.       Physical Examination:   General appearance: alert, well appearing, and in no distress  Psych: mood appropriate, normal affect  Skin: warm & dry   Cardiovascular: normal heart rate noted  Respiratory: normal respiratory effort, no distress  Abdomen: soft, non-tender   Pelvic: VULVA: normal appearing vulva with no masses, tenderness or lesions, VAGINA: normal appearing vagina with normal color and discharge, no lesions, CERVIX: normal appearing cervix without discharge or lesions, UTERUS: uterus is normal size, shape, consistency and nontender  Extremities: no edema   Endometrial Biopsy Procedure Note  Pre-operative Diagnosis: postmenopausal bleeding, thickened endometrium  Post-operative Diagnosis: same  Procedure Details    The risks (including infection, bleeding, pain, and uterine perforation) and benefits of the procedure were explained to the patient and Written informed consent was obtained.  Antibiotic prophylaxis against endocarditis was not indicated.   The patient was placed in the dorsal lithotomy position.  Bimanual exam showed the uterus to be in the neutral position.  A speculum inserted in the vagina, and the cervix prepped with betadine.     A single tooth tenaculum was applied to the anterior lip of the cervix for stabilization.  Os finder was used.  A Pipelle endometrial aspirator was used to sample the endometrium.  Sample was sent for pathologic examination.  Condition: Stable  Complications: None   Chaperone: Alan Fischer    Assessment & Plan:  1) EMB/Thickened endometrium -reviewed recent US .  Discussed thickened endometrium and recommendation to proceed with EMB - EMB completed as above -next pending pathology The patient was advised to call for any fever or for prolonged or severe pain or bleeding. She was advised to use OTC analgesics as needed for mild to moderate pain. She was advised to avoid vaginal  intercourse for 48 hours or until the bleeding has completely stopped.  2) suspected UTI - UA and culture collected, further management pending results  3) Right ovarian cyst, per MRI benign thecoma - Stable in size from MRI (11/2021) - No further management indicated at this time.  Would consider follow-up imaging as clinically indicated  Orders Placed This Encounter  Procedures   Urine Culture    Return in about 1 year (around 11/15/2024) for Annual/TBD.   Jacole Capley, DO Attending Obstetrician & Gynecologist, [facility] for [employer], [facility]

## 2023-11-16 NOTE — Progress Notes (Signed)
 PELVIC US  TA/TV: heterogeneous anteverted uterus,posterior submucosal vs subserosal fibroid 4.6 x 3.7 x 4 cm,multiple small nabothian cyst,avascular homogeneous thickened endometrium 10.7 mm,hypervascular solid echogenic right ovarian mass 3.5 x 2.6 x 2.7 cm,normal left ovary,no free fluid,no pain during ultrasound  Chaperone Tish

## 2023-11-17 LAB — SURGICAL PATHOLOGY

## 2023-11-19 LAB — URINE CULTURE

## 2023-11-20 ENCOUNTER — Ambulatory Visit: Payer: Self-pay | Admitting: Obstetrics & Gynecology

## 2023-11-20 ENCOUNTER — Encounter: Payer: Self-pay | Admitting: Obstetrics & Gynecology

## 2023-11-20 DIAGNOSIS — N950 Postmenopausal bleeding: Secondary | ICD-10-CM

## 2023-11-20 DIAGNOSIS — R9389 Abnormal findings on diagnostic imaging of other specified body structures: Secondary | ICD-10-CM

## 2023-11-20 MED ORDER — PROGESTERONE MICRONIZED 100 MG PO CAPS: 100.0000 mg | ORAL_CAPSULE | Freq: Every day | ORAL | 4 refills | Status: AC

## 2023-11-20 NOTE — Telephone Encounter (Signed)
-   Called patient with results of endometrial biopsy - No evidence of malignancy though scant endometrial tissue noted - In the setting of thickened endometrium and obesity, recommendation to start low-dose Prometrium daily -Should she have further episodes of bleeding, would recommend to proceed with hysteroscopy, D&C - Questions and concerns were addressed patient agreeable - Plan to follow-up in 3 to 4 months

## 2023-12-03 ENCOUNTER — Ambulatory Visit
Admission: EM | Admit: 2023-12-03 | Discharge: 2023-12-03 | Disposition: A | Discharge status: Home / Self Care | Attending: Nurse Practitioner | Admitting: Nurse Practitioner

## 2023-12-03 DIAGNOSIS — Z8709 Personal history of other diseases of the respiratory system: Secondary | ICD-10-CM

## 2023-12-03 DIAGNOSIS — R059 Cough, unspecified: Secondary | ICD-10-CM | POA: Diagnosis not present

## 2023-12-03 DIAGNOSIS — J208 Acute bronchitis due to other specified organisms: Secondary | ICD-10-CM

## 2023-12-03 MED ORDER — PROMETHAZINE-DM 6.25-15 MG/5ML PO SYRP: 5.0000 mL | ORAL_SOLUTION | Freq: Four times a day (QID) | ORAL | 0 refills | Status: AC | PRN

## 2023-12-03 MED ORDER — ALBUTEROL SULFATE HFA 108 (90 BASE) MCG/ACT IN AERS: 2.0000 | INHALATION_SPRAY | Freq: Four times a day (QID) | RESPIRATORY_TRACT | 0 refills | Status: AC | PRN

## 2023-12-03 MED ORDER — PREDNISONE 20 MG PO TABS: 40.0000 mg | ORAL_TABLET | Freq: Every day | ORAL | 0 refills | Status: AC

## 2023-12-03 NOTE — Discharge Instructions (Signed)
 Take medication as prescribed.  As discussed, continue to monitor your blood glucose closely while you are taking the prednisone.  If your blood glucose exceeds 300, discontinue the prednisone immediately. Increase fluids and allow for plenty of rest. You may take over-the-counter Tylenol  as needed for pain, fever, or general discomfort. Recommend the use of a humidifier in your bedroom at nighttime during sleep and sleeping elevated on pillows while cough symptoms persist. As discussed, your cough may continue to linger from days to weeks.  If you are generally feeling well but continued to have a persistent nagging cough, continue over-the-counter cough medications, fluids, and use of cough drops.  Seek care if you develop new symptoms such as fever, chills, worsening wheezing, shortness of breath, or other concerns. Follow-up as needed.

## 2023-12-03 NOTE — ED Provider Notes (Signed)
 [facility]    CSN: [id number] Arrival date & time: 12/03/23  0936      History   Chief Complaint Chief Complaint  Patient presents with   Cough    HPI Vanessa George is a 53 y.o. female.   The history is provided by the patient.   Patient presents for complaints of cough and shortness of breath.  Patient states cough is been present for the past week.  She states she did have some shortness of breath this morning upon wakening.  Patient also states that she coughed up white thick phlegm that was blood-tinged.  She denies fever, chills, chest pain, difficulty breathing, abdominal pain, nausea, vomiting, diarrhea, or rash.  Patient reports that she does have underlying history of asthma, states she has been using her albuterol  inhaler.  She also reports underlying history of diabetes, last A1c was 6 point something per the patient's report.  Patient states she has been taking over-the-counter medications for her symptoms.  States that she has taken several home COVID test, all were negative.  Past Medical History:  Diagnosis Date   Acid reflux    Anemia    Anxiety    Asthma    Depression    Diabetes mellitus without complication (HCC)    Edema of both lower extremities    Essential hypertension    High cholesterol    SOB (shortness of breath)     Patient Active Problem List   Diagnosis Date Noted   Hypoglycemia associated with type 2 diabetes mellitus (HCC) 04/27/2023   Numbness and tingling in right hand 04/27/2023   Vitamin D  deficiency 12/07/2022   Diabetes mellitus (HCC) 11/22/2022   Primary hypertension 11/22/2022   Morbid obesity (HCC), Starting BMI 42.63 11/22/2022   Hyperlipidemia associated with type 2 diabetes mellitus (HCC) 11/22/2022   Metabolic dysfunction-associated steatotic liver disease (MASLD) 11/22/2022   Hirsutism 11/22/2022    Past Surgical History:  Procedure Laterality Date   TUBAL LIGATION      OB History     Gravida  4    Para      Term      Preterm      AB  4   Living  0      SAB  2   IAB      Ectopic  1   Multiple      Live Births               Home Medications    Prior to Admission medications   Medication Sig Start Date End Date Taking? Authorizing Provider  albuterol  (VENTOLIN  HFA) 108 (90 Base) MCG/ACT inhaler Inhale 2 puffs into the lungs every 6 (six) hours as needed. 12/03/23  Yes Leath-Warren, Etta PARAS, NP  predniSONE (DELTASONE) 20 MG tablet Take 2 tablets (40 mg total) by mouth daily with breakfast for 5 days. 12/03/23 12/08/23 Yes Leath-Warren, Etta PARAS, NP  promethazine -dextromethorphan (PROMETHAZINE -DM) 6.25-15 MG/5ML syrup Take 5 mLs by mouth 4 (four) times daily as needed. 12/03/23  Yes Leath-Warren, Etta PARAS, NP  ALPRAZolam  (XANAX ) 0.25 MG tablet Take 0.25 mg by mouth 2 (two) times daily as needed. 09/29/23   [provider]  amLODipine -olmesartan (AZOR) 5-40 MG tablet Take 1 tablet by mouth daily. 11/10/20   [provider]  aspirin 81 MG chewable tablet Chew 81 mg by mouth daily.     [provider]  atorvastatin (LIPITOR) 10 MG tablet Take 10 mg by mouth at bedtime. 02/01/21  [provider]  Blood Glucose Monitoring Suppl DEVI 1 each by Does not apply route in the morning, at noon, and at bedtime. May substitute to any manufacturer covered by patient's insurance. 03/01/23   Francyne Romano, MD  Cholecalciferol (VITAMIN D3) 50 MCG (2000 UT) capsule Take 1 capsule (2,000 Units total) by mouth daily. 12/07/22   Francyne Romano, MD  Glucose Blood (BLOOD GLUCOSE TEST STRIPS) STRP 1 each by In Vitro route daily as needed for up to 60 doses. May substitute to any manufacturer covered by patient's insurance. 03/01/23   Francyne Romano, MD  metFORMIN  (GLUCOPHAGE -XR) 500 MG 24 hr tablet Take 1 tablet (500 mg total) by mouth daily with breakfast. 03/29/23   Francyne Romano, MD  methocarbamol  (ROBAXIN ) 500 MG tablet Take 1 tablet (500 mg  total) by mouth 2 (two) times daily. 05/29/23   Smoot, Lauraine LABOR, PA-C  Multiple Vitamin (MULTIVITAMIN WITH MINERALS) TABS tablet Take 1 tablet by mouth daily.    [provider]  Potassium 99 MG TABS Take by mouth as needed. Patient not taking: Reported on 10/02/2023    [provider]  progesterone (PROMETRIUM) 100 MG capsule Take 1 capsule (100 mg total) by mouth daily. 11/20/23 02/18/24  Ozan, Jennifer, DO  tirzepatide  (MOUNJARO ) 5 MG/0.5ML Pen Inject 5 mg into the skin once a week. Patient not taking: Reported on 10/02/2023 05/10/23   Francyne Romano, MD  vitamin B-12 (CYANOCOBALAMIN ) 50 MCG tablet Take 50 mcg by mouth daily.    [provider]    Family History Family History  Problem Relation Age of Onset   AAA (abdominal aortic aneurysm) Mother    Diabetes Mother    Depression Mother    Anxiety disorder Mother    Heart attack Father        Age 31   High blood pressure Father    High Cholesterol Father    Heart disease Father    Alcoholism Father     Social History Social History   Tobacco Use   Smoking status: Never    Passive exposure: Never   Smokeless tobacco: Never  Vaping Use   Vaping status: Never Used  Substance Use Topics   Alcohol use: Yes    Comment: occ   Drug use: No     Allergies   Vicodin [hydrocodone-acetaminophen ]   Review of Systems Review of Systems Per HPI  Physical Exam Triage Vital Signs ED Triage Vitals  Encounter Vitals Group     BP 12/03/23 0944 133/86     Girls Systolic BP Percentile --      Girls Diastolic BP Percentile --      Boys Systolic BP Percentile --      Boys Diastolic BP Percentile --      Pulse Rate 12/03/23 0944 82     Resp 12/03/23 0944 16     Temp 12/03/23 0944 98.3 F (36.8 C)     Temp Source 12/03/23 0944 Oral     SpO2 12/03/23 0944 97 %     Weight --      Height --      Head Circumference --      Peak Flow --      Pain Score 12/03/23 0943 0     Pain Loc --      Pain Education  --      Exclude from Growth Chart --    No data found.  Updated Vital Signs BP 133/86 (BP Location: Right Arm)   Pulse  82   Temp 98.3 F (36.8 C) (Oral)   Resp 16   SpO2 97%   Visual Acuity Right Eye Distance:   Left Eye Distance:   Bilateral Distance:    Right Eye Near:   Left Eye Near:    Bilateral Near:     Physical Exam Vitals and nursing note reviewed.  Constitutional:      General: She is not in acute distress.    Appearance: Normal appearance. She is well-developed.  HENT:     Head: Normocephalic and atraumatic.     Right Ear: Tympanic membrane, ear canal and external ear normal.     Left Ear: Tympanic membrane, ear canal and external ear normal.     Nose: Congestion present.     Right Turbinates: Enlarged and swollen.     Left Turbinates: Enlarged and swollen.     Right Sinus: No maxillary sinus tenderness or frontal sinus tenderness.     Left Sinus: No maxillary sinus tenderness or frontal sinus tenderness.     Mouth/Throat:     Lips: Pink.     Mouth: Mucous membranes are moist.     Pharynx: Uvula midline. Posterior oropharyngeal erythema and postnasal drip present. No pharyngeal swelling, oropharyngeal exudate or uvula swelling.     Comments: Cobblestoning present to posterior oropharynx  Eyes:     Extraocular Movements: Extraocular movements intact.     Conjunctiva/sclera: Conjunctivae normal.     Pupils: Pupils are equal, round, and reactive to light.  Neck:     Thyroid : No thyromegaly.     Trachea: No tracheal deviation.  Cardiovascular:     Rate and Rhythm: Normal rate and regular rhythm.     Pulses: Normal pulses.     Heart sounds: Normal heart sounds.  Pulmonary:     Effort: Pulmonary effort is normal. No respiratory distress.     Breath sounds: Normal breath sounds. No stridor. No wheezing, rhonchi or rales.  Abdominal:     General: Bowel sounds are normal.     Palpations: Abdomen is soft.     Tenderness: There is no abdominal tenderness.   Musculoskeletal:     Cervical back: Normal range of motion and neck supple.  Skin:    General: Skin is warm and dry.  Neurological:     General: No focal deficit present.     Mental Status: She is alert and oriented to person, place, and time.  Psychiatric:        Mood and Affect: Mood normal.        Behavior: Behavior normal.        Thought Content: Thought content normal.        Judgment: Judgment normal.      UC Treatments / Results  Labs (all labs ordered are listed, but only abnormal results are displayed) Labs Reviewed - No data to display   EKG   Radiology No results found.  Procedures Procedures (including critical care time)  Medications Ordered in UC Medications - No data to display  Initial Impression / Assessment and Plan / UC Course  I have reviewed the triage vital signs and the nursing notes.  Pertinent labs & imaging results that were available during my care of the patient were reviewed by me and considered in my medical decision making (see chart for details).  On initial exam, the patient's lung sounds are clear throughout, room air sats are at 97%.  The patient is well-appearing, she is in no acute distress, vital  signs are stable.  She does not exhibit any wheezing or shortness of breath at this time.  Symptoms are consistent with a viral URI with cough that most likely has exacerbated her asthma, and because the cough has been consistent, suspect likely bronchitis.  Will provide treatment to include prednisone 40 mg, and albuterol  inhaler for wheezing or shortness of breath, and Promethazine  DM for the cough.  Supportive care recommendations were provided discussed with the patient to include fluids, rest, over-the-counter analgesics, and use of a humidifier during sleep.  Discussed indications with patient regarding follow-up.  Patient was in agreement with this plan of care and verbalizes understanding.  All questions were answered.  Patient stable for  discharge.  Final Clinical Impressions(s) / UC Diagnoses   Final diagnoses:  Cough, unspecified type  Viral bronchitis     Discharge Instructions      Take medication as prescribed.  As discussed, continue to monitor your blood glucose closely while you are taking the prednisone.  If your blood glucose exceeds 300, discontinue the prednisone immediately. Increase fluids and allow for plenty of rest. You may take over-the-counter Tylenol  as needed for pain, fever, or general discomfort. Recommend the use of a humidifier in your bedroom at nighttime during sleep and sleeping elevated on pillows while cough symptoms persist. As discussed, your cough may continue to linger from days to weeks.  If you are generally feeling well but continued to have a persistent nagging cough, continue over-the-counter cough medications, fluids, and use of cough drops.  Seek care if you develop new symptoms such as fever, chills, worsening wheezing, shortness of breath, or other concerns. Follow-up as needed.     ED Prescriptions     Medication Sig Dispense Auth. Provider   predniSONE (DELTASONE) 20 MG tablet Take 2 tablets (40 mg total) by mouth daily with breakfast for 5 days. 10 tablet Leath-Warren, Etta PARAS, NP   albuterol  (VENTOLIN  HFA) 108 (90 Base) MCG/ACT inhaler Inhale 2 puffs into the lungs every 6 (six) hours as needed. 8 g Leath-Warren, Etta PARAS, NP   promethazine -dextromethorphan (PROMETHAZINE -DM) 6.25-15 MG/5ML syrup Take 5 mLs by mouth 4 (four) times daily as needed. 118 mL Leath-Warren, Etta PARAS, NP      PDMP not reviewed this encounter.   Gilmer Etta PARAS, NP 12/03/23 1005

## 2023-12-03 NOTE — ED Triage Notes (Addendum)
 Pt states cough for one week with some SOB.  States this morning she coughed up some white phlegm that was blood tinged. States has been taking OTC cold medicine with some relief and using her inhaler. States she took multiple covid tests at home that were negative.

## 2024-02-19 ENCOUNTER — Other Ambulatory Visit: Payer: Self-pay | Admitting: Orthopedic Surgery

## 2024-02-26 ENCOUNTER — Emergency Department (HOSPITAL_COMMUNITY)
Admission: EM | Admit: 2024-02-26 | Discharge: 2024-02-26 | Disposition: A | Discharge status: Home / Self Care | Attending: Emergency Medicine | Admitting: Emergency Medicine

## 2024-02-26 ENCOUNTER — Other Ambulatory Visit: Payer: Self-pay

## 2024-02-26 ENCOUNTER — Encounter (HOSPITAL_COMMUNITY): Payer: Self-pay

## 2024-02-26 ENCOUNTER — Emergency Department (HOSPITAL_COMMUNITY)

## 2024-02-26 DIAGNOSIS — Z7984 Long term (current) use of oral hypoglycemic drugs: Secondary | ICD-10-CM | POA: Insufficient documentation

## 2024-02-26 DIAGNOSIS — I10 Essential (primary) hypertension: Secondary | ICD-10-CM | POA: Insufficient documentation

## 2024-02-26 DIAGNOSIS — Z7982 Long term (current) use of aspirin: Secondary | ICD-10-CM | POA: Insufficient documentation

## 2024-02-26 DIAGNOSIS — Z79899 Other long term (current) drug therapy: Secondary | ICD-10-CM | POA: Insufficient documentation

## 2024-02-26 DIAGNOSIS — E119 Type 2 diabetes mellitus without complications: Secondary | ICD-10-CM | POA: Insufficient documentation

## 2024-02-26 DIAGNOSIS — M5412 Radiculopathy, cervical region: Secondary | ICD-10-CM | POA: Insufficient documentation

## 2024-02-26 LAB — CBC
HCT: 43.8 % (ref 36.0–46.0)
Hemoglobin: 13.7 g/dL (ref 12.0–15.0)
MCH: 26.2 pg (ref 26.0–34.0)
MCHC: 31.3 g/dL (ref 30.0–36.0)
MCV: 83.9 fL (ref 80.0–100.0)
Platelets: 217 10*3/uL (ref 150–400)
RBC: 5.22 MIL/uL — ABNORMAL HIGH (ref 3.87–5.11)
RDW: 15.7 % — ABNORMAL HIGH (ref 11.5–15.5)
WBC: 10.5 10*3/uL (ref 4.0–10.5)
nRBC: 0 % (ref 0.0–0.2)

## 2024-02-26 LAB — BASIC METABOLIC PANEL WITH GFR
Anion gap: 10 (ref 5–15)
BUN: 8 mg/dL (ref 6–20)
CO2: 27 mmol/L (ref 22–32)
Calcium: 9 mg/dL (ref 8.9–10.3)
Chloride: 103 mmol/L (ref 98–111)
Creatinine, Ser: 1.03 mg/dL — ABNORMAL HIGH (ref 0.44–1.00)
GFR, Estimated: >60 mL/min
Glucose, Bld: 143 mg/dL — ABNORMAL HIGH (ref 70–99)
Potassium: 4.1 mmol/L (ref 3.5–5.1)
Sodium: 140 mmol/L (ref 135–145)

## 2024-02-26 LAB — MAGNESIUM: Magnesium: 2.3 mg/dL (ref 1.7–2.4)

## 2024-02-26 LAB — TROPONIN T, HIGH SENSITIVITY: Troponin T High Sensitivity: 11 ng/L (ref 0–19)

## 2024-02-26 LAB — D-DIMER, QUANTITATIVE: D-Dimer, Quant: 0.53 ug{FEU}/mL — ABNORMAL HIGH (ref 0.00–0.50)

## 2024-02-26 MED ORDER — PREDNISONE 10 MG (21) PO TBPK: ORAL_TABLET | Freq: Every day | ORAL | 0 refills | Status: AC

## 2024-02-26 MED ORDER — PREDNISONE 50 MG PO TABS
60.0000 mg | ORAL_TABLET | Freq: Once | ORAL | Status: AC
  Administered 2024-02-26: 60 mg via ORAL
  Filled 2024-02-26: qty 1

## 2024-02-26 NOTE — Discharge Instructions (Addendum)
 Your lab results today were reassuring.  Your CT scan of cervical spine does show areas of narrowing which is likely the cause of your right arm pain.  A prescription for a steroid taper was sent to your pharmacy.  Take as prescribed.  Take ibuprofen  and Tylenol  as needed for pain.  Call the telephone number below to establish care with a spine doctor.  Return to the emergency department for any new or worsening symptoms of concern.

## 2024-02-26 NOTE — ED Triage Notes (Signed)
 Pt here with c/o R shoulder pain that started today. Denies any known injury. Pt also c/o knot located above brachial area of R arm and states it is also sore and pain radiates down the R arm.

## 2024-02-26 NOTE — ED Notes (Signed)
 Patient transported to CT
